# Patient Record
Sex: Male | Born: 1940 | Race: White | Hispanic: No | Marital: Married | Smoking: Former smoker
Health system: Southern US, Community
[De-identification: ages and names within clinical notes are randomized; demographics above are authoritative.]

## PROBLEM LIST (undated history)

## (undated) DIAGNOSIS — N200 Calculus of kidney: Secondary | ICD-10-CM

## (undated) DIAGNOSIS — I428 Other cardiomyopathies: Secondary | ICD-10-CM

## (undated) DIAGNOSIS — I872 Venous insufficiency (chronic) (peripheral): Secondary | ICD-10-CM

## (undated) DIAGNOSIS — I10 Essential (primary) hypertension: Secondary | ICD-10-CM

## (undated) DIAGNOSIS — R0602 Shortness of breath: Secondary | ICD-10-CM

## (undated) DIAGNOSIS — Z86718 Personal history of other venous thrombosis and embolism: Secondary | ICD-10-CM

## (undated) DIAGNOSIS — I429 Cardiomyopathy, unspecified: Secondary | ICD-10-CM

## (undated) DIAGNOSIS — F32A Depression, unspecified: Secondary | ICD-10-CM

## (undated) DIAGNOSIS — I4891 Unspecified atrial fibrillation: Secondary | ICD-10-CM

## (undated) DIAGNOSIS — H269 Unspecified cataract: Secondary | ICD-10-CM

## (undated) DIAGNOSIS — N189 Chronic kidney disease, unspecified: Secondary | ICD-10-CM

## (undated) DIAGNOSIS — I499 Cardiac arrhythmia, unspecified: Secondary | ICD-10-CM

## (undated) DIAGNOSIS — N35919 Unspecified urethral stricture, male, unspecified site: Secondary | ICD-10-CM

## (undated) DIAGNOSIS — G709 Myoneural disorder, unspecified: Secondary | ICD-10-CM

## (undated) DIAGNOSIS — F329 Major depressive disorder, single episode, unspecified: Secondary | ICD-10-CM

## (undated) DIAGNOSIS — M1990 Unspecified osteoarthritis, unspecified site: Secondary | ICD-10-CM

## (undated) DIAGNOSIS — I739 Peripheral vascular disease, unspecified: Secondary | ICD-10-CM

## (undated) DIAGNOSIS — G4730 Sleep apnea, unspecified: Secondary | ICD-10-CM

## (undated) HISTORY — PX: BACK SURGERY: SHX140

## (undated) HISTORY — PX: SHOULDER SURGERY: SHX246

## (undated) HISTORY — PX: TONSILLECTOMY: SUR1361

## (undated) HISTORY — PX: HEMORROIDECTOMY: SUR656

## (undated) HISTORY — PX: COLONOSCOPY: SHX174

## (undated) HISTORY — DX: Unspecified atrial fibrillation: I48.91

## (undated) HISTORY — DX: Venous insufficiency (chronic) (peripheral): I87.2

## (undated) HISTORY — DX: Personal history of other venous thrombosis and embolism: Z86.718

## (undated) HISTORY — DX: Chronic kidney disease, unspecified: N18.9

## (undated) HISTORY — DX: Cardiomyopathy, unspecified: I42.9

## (undated) HISTORY — PX: KNEE SURGERY: SHX244

## (undated) HISTORY — DX: Other cardiomyopathies: I42.8

## (undated) HISTORY — PX: VASECTOMY: SHX75

---

## 2000-12-04 ENCOUNTER — Ambulatory Visit (HOSPITAL_BASED_OUTPATIENT_CLINIC_OR_DEPARTMENT_OTHER): Admission: RE | Admit: 2000-12-04 | Discharge: 2000-12-04 | Payer: Self-pay | Admitting: Orthopedic Surgery

## 2004-05-02 ENCOUNTER — Encounter: Admission: RE | Admit: 2004-05-02 | Discharge: 2004-05-02 | Payer: Self-pay | Admitting: Nephrology

## 2010-12-07 ENCOUNTER — Other Ambulatory Visit: Payer: Self-pay | Admitting: Family Medicine

## 2010-12-07 DIAGNOSIS — M545 Low back pain, unspecified: Secondary | ICD-10-CM

## 2010-12-11 ENCOUNTER — Ambulatory Visit
Admission: RE | Admit: 2010-12-11 | Discharge: 2010-12-11 | Disposition: A | Discharge status: Home / Self Care | Payer: Medicare Other | Source: Ambulatory Visit | Attending: Family Medicine | Admitting: Family Medicine

## 2010-12-11 DIAGNOSIS — M545 Low back pain, unspecified: Secondary | ICD-10-CM

## 2010-12-19 ENCOUNTER — Other Ambulatory Visit: Payer: Self-pay | Admitting: Family Medicine

## 2010-12-19 DIAGNOSIS — M79605 Pain in left leg: Secondary | ICD-10-CM

## 2010-12-19 DIAGNOSIS — M545 Low back pain, unspecified: Secondary | ICD-10-CM

## 2010-12-20 ENCOUNTER — Ambulatory Visit
Admission: RE | Admit: 2010-12-20 | Discharge: 2010-12-20 | Disposition: A | Discharge status: Home / Self Care | Payer: Medicare Other | Source: Ambulatory Visit | Attending: Family Medicine | Admitting: Family Medicine

## 2010-12-20 DIAGNOSIS — M545 Low back pain, unspecified: Secondary | ICD-10-CM

## 2010-12-20 DIAGNOSIS — M79605 Pain in left leg: Secondary | ICD-10-CM

## 2011-01-17 ENCOUNTER — Other Ambulatory Visit: Payer: Self-pay | Admitting: Family Medicine

## 2011-01-17 DIAGNOSIS — M545 Low back pain, unspecified: Secondary | ICD-10-CM

## 2011-01-18 ENCOUNTER — Ambulatory Visit
Admission: RE | Admit: 2011-01-18 | Discharge: 2011-01-18 | Disposition: A | Discharge status: Home / Self Care | Payer: Medicare Other | Source: Ambulatory Visit | Attending: Family Medicine | Admitting: Family Medicine

## 2011-01-18 DIAGNOSIS — M545 Low back pain, unspecified: Secondary | ICD-10-CM

## 2011-03-26 ENCOUNTER — Ambulatory Visit (HOSPITAL_BASED_OUTPATIENT_CLINIC_OR_DEPARTMENT_OTHER): Payer: Medicare Other | Attending: Cardiology

## 2011-03-26 DIAGNOSIS — G4733 Obstructive sleep apnea (adult) (pediatric): Secondary | ICD-10-CM | POA: Insufficient documentation

## 2011-03-26 DIAGNOSIS — I4891 Unspecified atrial fibrillation: Secondary | ICD-10-CM | POA: Insufficient documentation

## 2011-03-31 DIAGNOSIS — I4891 Unspecified atrial fibrillation: Secondary | ICD-10-CM

## 2011-03-31 DIAGNOSIS — G4733 Obstructive sleep apnea (adult) (pediatric): Secondary | ICD-10-CM

## 2011-03-31 NOTE — Procedures (Signed)
NAME:  JSHAUN, ABERNATHY NO.:  [id number]  MEDICAL RECORD NO.:  [id number]          PATIENT TYPE:  OUT  LOCATION:  SLEEP CENTER                 FACILITY:  [facility]  PHYSICIAN:  Lauraine Crespo D. Maple Hudson, MD, FCCP, FACPDATE OF BIRTH:  Oct 30[birth year] 1941  DATE OF STUDY:  03/26/2011                           NOCTURNAL POLYSOMNOGRAM  REFERRING PHYSICIAN:  W SPENCER TILLEY JR  INDICATION FOR STUDY:  Hypersomnia with sleep apnea.  EPWORTH SLEEPINESS SCORE:  4/24, BMI 42, weight 308 pounds, height 72 inches, neck 18 inches.  MEDICATIONS:  Charted and reviewed.  SLEEP ARCHITECTURE:  Split study protocol.  During the diagnostic phase, total sleep time 122 minutes with sleep efficiency 79.7%.  Stage I was 39.8%, stage II 56.6%, stage III 0%, REM 3.7% of total sleep time. Sleep latency 14 minutes, REM latency 81 minutes, awake after sleep onset 17 minutes, arousal index 27.  BEDTIME MEDICATION:  None.  RESPIRATORY DATA:  Split study protocol.  Apnea/hypopnea index (AHI) 70.8 per hour.  A total of 114 events was scored including 14 obstructive apneas, 4 central apneas, 1 mixed apnea, 125 hypopneas. Events were not positional.  REM AHI 93.3 per hour.  CPAP was then titrated to 15 CWP, AHI 26.3 per hour indicating inadequate control.  He was changed to bilevel (BiPAP) and titrated to a final pressure inspiratory 23/expiratory 18 CWP for an AHI of 8.7 per hour.  He wore a small ResMed Quattro FX full-face mask with heated humidifier.  Bi-Flex comfort setting of 3.  The patient removed his upper and lower partial dentures prior to the start of the study.  High leak was observed at higher pressures during the latter portion of the study and may require reconsideration of mask choice and best pressure settings in the home environment.  Help with this can be provided if necessary.  OXYGEN DATA:  Loud snoring before CPAP control with oxygen desaturation to a nadir of 75% on room air.  During the  CPAP and BiPAP titration, mean oxygen saturation held at 85.5% on room air.  Baseline room air oxygen saturation while awake on arrival was 93%.  Consider overnight oximetry while wearing BiPAP in the home environment to determine if supplemental therapeutic oxygen would be appropriate.  CARDIAC DATA:  Atrial fibrillation between 70 and 80 beats per minute.  MOVEMENT-PARASOMNIA:  No significant movement disturbance.  Bathroom x1.  IMPRESSIONS-RECOMMENDATIONS: 1. Severe obstructive sleep apnea/hypopnea syndrome, apnea/hypopnea     index 70.8 per hour with nonpositional events, loud snoring, and     oxygen desaturation to a nadir of 75% on room air. 2. Continuous positive airway pressure titration to 15 mL CWP failed     to provide adequate control with a residual apnea/hypopnea index of     26.3 per hour.  Bilevel (bilevel positive airway pressure)     titration to a final pressure inspiratory 23, expiratory 18 CWP     yielded an apnea/hypopnea index of 8.7 per hour.  He wore a small     ResMed Quattro FX full-face mask with a Bi-Flex setting of 3 and     heated humidity.  At higher pressures, significant mask leak was  noted.  He may require reconsideration of final pressure settings     and a mask fit. 3. Oxygen saturation remained low on room air despite titration.     Before continuous positive airway pressure, he desaturated to a     nadir of 75% on room air.  With PAP titration, mean oxygen     saturation on room air was 85.5%.  Saturation was 93% on room air     on arrival while awake.  Consider overnight oximetry while wearing     bilevel positive airway pressure in the home environment to     determine if supplemental oxygen during sleep would be appropriate.     Merlyn Conley D. Maple Hudson, MD, [facility], FACP Diplomate, Biomedical engineer of Sleep Medicine Electronically Signed    CDY/MEDQ  D:  03/31/2011 10:09:27  T:  03/31/2011 11:43:00  Job:  [id number]

## 2011-05-02 DIAGNOSIS — G4730 Sleep apnea, unspecified: Secondary | ICD-10-CM

## 2011-05-02 HISTORY — DX: Sleep apnea, unspecified: G47.30

## 2011-05-08 ENCOUNTER — Encounter: Payer: Self-pay | Admitting: Pulmonary Disease

## 2011-05-09 ENCOUNTER — Encounter: Payer: Self-pay | Admitting: Pulmonary Disease

## 2011-05-09 ENCOUNTER — Ambulatory Visit (INDEPENDENT_AMBULATORY_CARE_PROVIDER_SITE_OTHER): Payer: Medicare Other | Admitting: Pulmonary Disease

## 2011-05-09 VITALS — BP 120/78 | HR 72 | Temp 97.9°F | Ht 72.0 in | Wt 319.0 lb

## 2011-05-09 DIAGNOSIS — I749 Embolism and thrombosis of unspecified artery: Secondary | ICD-10-CM

## 2011-05-09 DIAGNOSIS — E119 Type 2 diabetes mellitus without complications: Secondary | ICD-10-CM | POA: Insufficient documentation

## 2011-05-09 DIAGNOSIS — I499 Cardiac arrhythmia, unspecified: Secondary | ICD-10-CM | POA: Insufficient documentation

## 2011-05-09 DIAGNOSIS — G4730 Sleep apnea, unspecified: Secondary | ICD-10-CM | POA: Insufficient documentation

## 2011-05-09 DIAGNOSIS — J309 Allergic rhinitis, unspecified: Secondary | ICD-10-CM | POA: Insufficient documentation

## 2011-05-09 DIAGNOSIS — I10 Essential (primary) hypertension: Secondary | ICD-10-CM | POA: Insufficient documentation

## 2011-05-09 DIAGNOSIS — G4733 Obstructive sleep apnea (adult) (pediatric): Secondary | ICD-10-CM | POA: Insufficient documentation

## 2011-05-09 HISTORY — DX: Type 2 diabetes mellitus without complications: E11.9

## 2011-05-09 HISTORY — DX: Embolism and thrombosis of unspecified artery: I74.9

## 2011-05-09 NOTE — Progress Notes (Signed)
Subjective:    Patient ID: Devin Rios, male    DOB: 05-27-1941, 70 y.o.   MRN: [id number]  HPI The patient is a 70 year old male who I've been asked to see or an abnormal sleep study.  Patient underwent nocturnal polysomnography in June of this year, and was found to have severe obstructive sleep apnea.  He underwent a split night protocol where he was found to have an apnea index of 71 events per hour in the first 2 hours of sleep, and was ultimately titrated to a fairly high level of pressure on bilevel in order to control his events.  The patient states that he does snore, but his wife really does not complain excessively about this.  He states that she is not commented on an abnormal breathing pattern during sleep.  He currently works rotating shifts, but feels that he is fairly rested upon arising from sleep.  He denies any abnormal sleepiness during his waking hours, and feels that his alertness is adequate.  He denies any sleepiness with driving.  He states that his weight is neutral from 2 years ago, and his Epworth score today is only 6.  What time do you typically go to bed?( Between what hours) I work rotating shifts How long does it take you to fall asleep? 10 min How many times during the night do you wake up? 2 What time do you get out of bed to start your day? Do you drive or operate heavy machinery in your occupation? No How much has your weight changed (up or down) over the past two years? (In pounds) 0 oz (0 kg) Have you ever had a sleep study before? Yes If yes, location of study? [facility] If yes, date of study? 03/27/2011 Do you currently use CPAP? No Do you wear oxygen at any time? No    Review of Systems  Constitutional: Negative.  Negative for fever and unexpected weight change.  HENT: Positive for congestion and sneezing. Negative for ear pain, nosebleeds, sore throat, rhinorrhea, trouble swallowing, dental problem, postnasal drip and sinus pressure.   Eyes: Negative.   Negative for redness and itching.  Respiratory: Negative.  Negative for cough, chest tightness, shortness of breath and wheezing.   Cardiovascular: Positive for palpitations. Negative for leg swelling.  Gastrointestinal: Negative.  Negative for nausea and vomiting.  Genitourinary: Negative.  Negative for dysuria.  Musculoskeletal: Negative.  Negative for joint swelling.  Skin: Negative for rash.  Neurological: Negative.  Negative for headaches.  Hematological: Negative.  Does not bruise/bleed easily.  Psychiatric/Behavioral: Negative.  Negative for dysphoric mood. The patient is not nervous/anxious.        Objective:   Physical Exam Constitutional:  Well developed, no acute distress  HENT:  Nares patent without discharge, but +turbinate hypertrophy  Oropharynx without exudate, palate and uvula are significantly elongated with narrowing posteriorly   Eyes:  Perrla, eomi, no scleral icterus  Neck:  No JVD, no TMG  Cardiovascular:  Normal rate, rhythm sounds regular currently, no rubs or gallops.  No murmurs        Intact distal pulses  Pulmonary :  Normal breath sounds, no stridor or respiratory distress   No rales, rhonchi, or wheezing  Abdominal:  Soft, nondistended, bowel sounds present.  No tenderness noted.   Musculoskeletal:  + lower extremity edema noted.  Lymph Nodes:  No cervical lymphadenopathy noted  Skin:  No cyanosis noted  Neurologic:  Alert, appropriate, moves all 4 extremities without obvious deficit.  Assessment & Plan:

## 2011-05-13 ENCOUNTER — Encounter: Payer: Self-pay | Admitting: Pulmonary Disease

## 2011-05-13 NOTE — Assessment & Plan Note (Signed)
The patient has severe obstructive sleep apnea on his recent sleep study, and would greatly benefit from a positive pressure device as well as weight loss.  However, the patient feels that he cannot tolerate anything "blowing on my face", and is not willing to try CPAP or even oxygen.  Quite frankly, he does not feel that he has an issue and doubts the significance of the sleep study results.  I have had a frank discussion with him about the impact of sleep apnea on his cardiovascular health.  He still is not willing to consider CPAP therapy or oxygen, but states that he will work on weight loss.  I have left the door open for him to call at any time to reconsider trying CPAP.

## 2011-05-13 NOTE — Patient Instructions (Signed)
Work on weight loss Please call if you wish to consider cpap or oxygen for treatment of your sleep apnea.

## 2012-06-09 ENCOUNTER — Other Ambulatory Visit: Payer: Self-pay | Admitting: Cardiology

## 2012-09-01 ENCOUNTER — Other Ambulatory Visit: Payer: Self-pay | Admitting: Nephrology

## 2012-09-01 ENCOUNTER — Ambulatory Visit
Admission: RE | Admit: 2012-09-01 | Discharge: 2012-09-01 | Disposition: A | Discharge status: Home / Self Care | Payer: Medicare Other | Source: Ambulatory Visit | Attending: Nephrology | Admitting: Nephrology

## 2012-09-01 DIAGNOSIS — R109 Unspecified abdominal pain: Secondary | ICD-10-CM

## 2012-09-01 DIAGNOSIS — N200 Calculus of kidney: Secondary | ICD-10-CM

## 2012-10-16 ENCOUNTER — Other Ambulatory Visit: Payer: Self-pay | Admitting: Urology

## 2012-10-22 ENCOUNTER — Encounter (HOSPITAL_BASED_OUTPATIENT_CLINIC_OR_DEPARTMENT_OTHER): Payer: Self-pay | Admitting: *Deleted

## 2012-10-22 NOTE — Progress Notes (Signed)
Last office visit note, most recent ekg, and cardiac studies requested from Dr. York Spaniel office.

## 2012-10-22 NOTE — Progress Notes (Signed)
Pt instructed npo p mn 1/27 x lotrel, coreg  w sip of water.  Ok to take pain med w sip of water if neede.  To [facility] 1/28 @ 0845.  Needs istat on arrival.  Left message for [facility] @  [facility] regarding  When pt is to stop eliquis. Dr. York Spaniel office closed for lunch.  Will call later to request ekg, last office visit note, and cardiac studies.

## 2012-10-24 NOTE — Progress Notes (Signed)
Called and spoke with Mr Formica-informed of new arrival time of 0730 on 10/28/12.

## 2012-10-27 NOTE — H&P (Signed)
History of Present Illness                 F/u LUTS and back pain referred by Dr. Arrie Aran Dec 2013. Patient has had feelings of  weak stream and back pain for about 6 weeks. He had dysuria. He voids with a good stream, no straining. He feels empty. No incontience. No gross hematuria. He took Cipro and another abx. Abx helped the symptoms but he only took 3 -5 days. His U/A today shows possible UTI. His urine has been cloudy and he is getting some dysuria again. No fever or chills.   -Sep 2013 labs: bun 18, cr 1.6 (gfr ~ 45), lft's normal -Dec 2013 CT A/P showed a RL cyst, no renal or ureteral calculi and no hydronephrosis. I reviewed all the images.   -Dec 2013 - normal DRE; urine Cx + e coli and tx with cephalexin.   His PMH includes coumadin use, a fib, stage III CKD, HTN, DM, OSA, obesity.   He has had trouble getting and maintaining erection for several years. He does get some AM erections. He bought a VED but hadnt tried it.    Interval Hx He returns for PVR and cystoscopy.   Jan 2014 / today PVR = 65 ml  He recalls needing a catheter when he had hemorrhoid surgery in the past. We discussed proceeding with cystoscopy to investigate the cause of his lower urinary tract symptoms and UTI.   Past Medical History Problems  1. History of  Atrial Fibrillation 427.31 2. History of  Diabetes Mellitus 250.00 3. History of  Hypertension 401.9 4. Tobacco Use 305.1  Surgical History Problems  1. History of  Back Surgery 2. History of  Knee Surgery Right 3. History of  Shoulder Surgery Right 4. History of  Surgery Of Male Genitalia Vasectomy V25.2 5. History of  Tonsillectomy  Current Meds 1. AmLODIPine Besylate TABS; Therapy: (Recorded:30Dec2013) to 2. Carvedilol 25 MG Oral Tablet; Therapy: (Recorded:30Dec2013) to 3. Cephalexin 500 MG Oral Capsule; TAKE 1 CAPSULE TWICE DAILY; Therapy: 03Jan2014 to  (Evaluate:17Jan2014)  Requested for: 07Jan2014; Last Rx:03Jan2014 4. Eliquis 5 MG  Oral Tablet; Therapy: (Recorded:30Dec2013) to 5. Furosemide 40 MG Oral Tablet; Therapy: (Recorded:30Dec2013) to 6. Micardis TABS; Therapy: (Recorded:30Dec2013) to 7. Spironolactone 25 MG Oral Tablet; Therapy: (Recorded:30Dec2013) to  Allergies Medication  1. No Known Drug Allergies  Family History Problems  1. Maternal history of  Family Health Status Number Of Children 1 son 2. Maternal history of  Father Deceased At Age 8 3. Maternal history of  Mother Deceased At Age 79 4. Paternal history of  Nephrolithiasis 5. Paternal history of  Stroke Syndrome V17.1 6. Maternal history of  Stroke Syndrome V17.1  Social History Problems  1. Caffeine Use 4-5/day 2. Marital History - Currently Married Denied  3. History of  Alcohol Use  Review of Systems Constitutional, cardiovascular, pulmonary and neurological system(s) were reviewed and pertinent findings if present are noted.    Physical Exam Constitutional: Well nourished and well developed . No acute distress.  Pulmonary: No respiratory distress and normal respiratory rhythm and effort.  Cardiovascular: Heart rate and rhythm are normal . No peripheral edema.  Neuro/Psych:. Mood and affect are appropriate.    Results/Data Urine [Data Includes: Last 1 Day]   15Jan2014  COLOR YELLOW   APPEARANCE CLEAR   SPECIFIC GRAVITY 1.015   pH 5.5   GLUCOSE NEG mg/dL  BILIRUBIN NEG   KETONE NEG mg/dL  BLOOD NEG   PROTEIN  NEG mg/dL  UROBILINOGEN 0.2 mg/dL  NITRITE NEG   LEUKOCYTE ESTERASE TRACE   SQUAMOUS EPITHELIAL/HPF RARE   WBC 3-6 WBC/hpf  RBC NONE SEEN RBC/hpf  BACTERIA RARE   CRYSTALS NONE SEEN   CASTS NONE SEEN    Procedure  Procedure: Cystoscopy   Indication: Lower Urinary Tract Symptoms. Frequent UTI.  Informed Consent: Risks, benefits, and potential adverse events were discussed and informed consent was obtained from the patient.  Prep: The patient was prepped with betadine.  Antibiotic prophylaxis:. Cephalexin.    Procedure Note:  Urethral meatus:. No abnormalities.  Anterior urethra:. A moderate stricture was present in the bulbar urethra measuring cm, but was not manipulated.  Bladder: The patient tolerated the procedure well.  Complications: None.    Assessment Assessed  1. Benign Prostatic Hypertrophy 600.00 2. Pyuria 791.9 3. 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 Follow-up Schedule Surgery Office  Follow-up  Done: 15Jan2014  UA With REFLEX  Status: Resulted - Requires Verification  Done: 01Jan0001 12:00AM Ordered Today; For: Health Maintenance (V70.0); Ordered By: Jerilee Field  Due: 17Jan2014 Marked Important; Last Updated By: Thomasenia Sales   Discussion/Summary     I discussed the cystoscopic findings with the patient. We discussed the nature, R/B of urethral stricture dilation in the OR. We discussed general surgical and anesthetic risks, and other options including watchful waiting were discussed. He understands that dilation is generally successful in most cases but long-term success rates are low. We talked about the risk of persistent, de novo, or worsening incontinence and voiding dysfunction. Risks were described but not limited to the discussion of injury to neighboring structures and soft tissues. Bleeding, infection, pain, erectile dysfunction, and spraying of urination were discussed. The risk of neuropathy was discussed as well as the usual post-operative course.  All questions answered. Pt elects to proceed.    cc; Dr. Arrie Aran     Signatures Electronically signed by : Jerilee Field, M.D.; Oct 15 2012  3:03PM

## 2012-10-28 ENCOUNTER — Encounter (HOSPITAL_BASED_OUTPATIENT_CLINIC_OR_DEPARTMENT_OTHER): Payer: Self-pay | Admitting: Anesthesiology

## 2012-10-28 ENCOUNTER — Encounter (HOSPITAL_BASED_OUTPATIENT_CLINIC_OR_DEPARTMENT_OTHER)
Admission: RE | Disposition: A | Discharge status: Home / Self Care | Payer: Self-pay | Source: Ambulatory Visit | Attending: Urology

## 2012-10-28 ENCOUNTER — Ambulatory Visit (HOSPITAL_BASED_OUTPATIENT_CLINIC_OR_DEPARTMENT_OTHER): Payer: Medicare Other | Admitting: Anesthesiology

## 2012-10-28 ENCOUNTER — Ambulatory Visit (HOSPITAL_BASED_OUTPATIENT_CLINIC_OR_DEPARTMENT_OTHER)
Admission: RE | Admit: 2012-10-28 | Discharge: 2012-10-28 | Disposition: A | Discharge status: Home / Self Care | Payer: Medicare Other | Source: Ambulatory Visit | Attending: Urology | Admitting: Urology

## 2012-10-28 ENCOUNTER — Encounter (HOSPITAL_BASED_OUTPATIENT_CLINIC_OR_DEPARTMENT_OTHER): Payer: Self-pay | Admitting: *Deleted

## 2012-10-28 DIAGNOSIS — E669 Obesity, unspecified: Secondary | ICD-10-CM | POA: Insufficient documentation

## 2012-10-28 DIAGNOSIS — N35919 Unspecified urethral stricture, male, unspecified site: Secondary | ICD-10-CM | POA: Insufficient documentation

## 2012-10-28 DIAGNOSIS — I10 Essential (primary) hypertension: Secondary | ICD-10-CM | POA: Insufficient documentation

## 2012-10-28 DIAGNOSIS — N390 Urinary tract infection, site not specified: Secondary | ICD-10-CM | POA: Insufficient documentation

## 2012-10-28 DIAGNOSIS — Z79899 Other long term (current) drug therapy: Secondary | ICD-10-CM | POA: Insufficient documentation

## 2012-10-28 DIAGNOSIS — G4733 Obstructive sleep apnea (adult) (pediatric): Secondary | ICD-10-CM | POA: Insufficient documentation

## 2012-10-28 DIAGNOSIS — E119 Type 2 diabetes mellitus without complications: Secondary | ICD-10-CM | POA: Insufficient documentation

## 2012-10-28 DIAGNOSIS — N400 Enlarged prostate without lower urinary tract symptoms: Secondary | ICD-10-CM | POA: Insufficient documentation

## 2012-10-28 DIAGNOSIS — I4891 Unspecified atrial fibrillation: Secondary | ICD-10-CM | POA: Insufficient documentation

## 2012-10-28 HISTORY — DX: Unspecified cataract: H26.9

## 2012-10-28 HISTORY — DX: Sleep apnea, unspecified: G47.30

## 2012-10-28 HISTORY — DX: Unspecified urethral stricture, male, unspecified site: N35.919

## 2012-10-28 HISTORY — DX: Unspecified osteoarthritis, unspecified site: M19.90

## 2012-10-28 HISTORY — PX: CYSTOSCOPY WITH URETHRAL DILATATION: SHX5125

## 2012-10-28 LAB — POCT I-STAT 4, (NA,K, GLUC, HGB,HCT)
Glucose, Bld: 178 mg/dL — ABNORMAL HIGH (ref 70–99)
HCT: 43 % (ref 39.0–52.0)
Hemoglobin: 14.6 g/dL (ref 13.0–17.0)
Potassium: 4.1 mEq/L (ref 3.5–5.1)
Sodium: 142 mEq/L (ref 135–145)

## 2012-10-28 LAB — GLUCOSE, CAPILLARY: Glucose-Capillary: 157 mg/dL — ABNORMAL HIGH (ref 70–99)

## 2012-10-28 SURGERY — CYSTOSCOPY, WITH URETHRAL DILATION
Anesthesia: General | Site: Urethra | Wound class: Clean Contaminated

## 2012-10-28 MED ORDER — CEFAZOLIN SODIUM 1-5 GM-% IV SOLN
1.0000 g | INTRAVENOUS | Status: DC
  Filled 2012-10-28: qty 50

## 2012-10-28 MED ORDER — CEFAZOLIN SODIUM 10 G IJ SOLR
3.0000 g | INTRAMUSCULAR | Status: AC
  Administered 2012-10-28: 3 g via INTRAVENOUS
  Filled 2012-10-28: qty 3000

## 2012-10-28 MED ORDER — PROMETHAZINE HCL 25 MG/ML IJ SOLN
6.2500 mg | INTRAMUSCULAR | Status: DC | PRN
  Filled 2012-10-28: qty 1

## 2012-10-28 MED ORDER — FENTANYL CITRATE 0.05 MG/ML IJ SOLN
25.0000 ug | INTRAMUSCULAR | Status: DC | PRN
  Filled 2012-10-28: qty 1

## 2012-10-28 MED ORDER — EPHEDRINE SULFATE 50 MG/ML IJ SOLN
INTRAMUSCULAR | Status: DC | PRN
  Administered 2012-10-28: 10 mg via INTRAVENOUS

## 2012-10-28 MED ORDER — CEPHALEXIN 500 MG PO CAPS: 500.0000 mg | ORAL_CAPSULE | Freq: Every day | ORAL | Status: DC

## 2012-10-28 MED ORDER — STERILE WATER FOR IRRIGATION IR SOLN
Status: DC | PRN
  Administered 2012-10-28: 3000 mL

## 2012-10-28 MED ORDER — LACTATED RINGERS IV SOLN
INTRAVENOUS | Status: DC
  Administered 2012-10-28 (×2): via INTRAVENOUS
  Filled 2012-10-28: qty 1000

## 2012-10-28 MED ORDER — FENTANYL CITRATE 0.05 MG/ML IJ SOLN
INTRAMUSCULAR | Status: DC | PRN
  Administered 2012-10-28: 25 ug via INTRAVENOUS
  Administered 2012-10-28: 50 ug via INTRAVENOUS
  Administered 2012-10-28: 25 ug via INTRAVENOUS

## 2012-10-28 MED ORDER — PROPOFOL 10 MG/ML IV BOLUS
INTRAVENOUS | Status: DC | PRN
  Administered 2012-10-28: 30 mg via INTRAVENOUS
  Administered 2012-10-28: 300 mg via INTRAVENOUS

## 2012-10-28 MED ORDER — LIDOCAINE HCL (CARDIAC) 20 MG/ML IV SOLN
INTRAVENOUS | Status: DC | PRN
  Administered 2012-10-28: 80 mg via INTRAVENOUS

## 2012-10-28 SURGICAL SUPPLY — 22 items
BAG DRAIN URO-CYSTO SKYTR STRL (DRAIN) ×2 IMPLANT
BAG URINE LEG 19OZ MD ST LTX (BAG) ×2 IMPLANT
BALLN NEPHROSTOMY (BALLOONS)
BALLOON NEPHROSTOMY (BALLOONS) IMPLANT
CANISTER SUCT LVC 12 LTR MEDI- (MISCELLANEOUS) ×2 IMPLANT
CATH FOLEY 2WAY SLVR  5CC 18FR (CATHETERS) ×1
CATH FOLEY 2WAY SLVR 5CC 18FR (CATHETERS) ×1 IMPLANT
CLOTH BEACON ORANGE TIMEOUT ST (SAFETY) ×2 IMPLANT
DRAPE CAMERA CLOSED 9X96 (DRAPES) ×2 IMPLANT
ELECT REM PT RETURN 9FT ADLT (ELECTROSURGICAL)
ELECTRODE REM PT RTRN 9FT ADLT (ELECTROSURGICAL) IMPLANT
GLOVE BIO SURGEON STRL SZ 6.5 (GLOVE) ×2 IMPLANT
GLOVE BIO SURGEON STRL SZ7.5 (GLOVE) ×2 IMPLANT
GLOVE ECLIPSE 6.0 STRL STRAW (GLOVE) ×2 IMPLANT
GOWN PREVENTION PLUS LG XLONG (DISPOSABLE) ×4 IMPLANT
GOWN STRL REIN XL XLG (GOWN DISPOSABLE) IMPLANT
GUIDEWIRE 0.038 PTFE COATED (WIRE) IMPLANT
GUIDEWIRE ANG ZIPWIRE 038X150 (WIRE) IMPLANT
GUIDEWIRE STR DUAL SENSOR (WIRE) IMPLANT
PACK CYSTOSCOPY (CUSTOM PROCEDURE TRAY) ×2 IMPLANT
SYRINGE IRR TOOMEY STRL 70CC (SYRINGE) IMPLANT
WATER STERILE IRR 3000ML UROMA (IV SOLUTION) ×2 IMPLANT

## 2012-10-28 NOTE — Anesthesia Preprocedure Evaluation (Addendum)
Anesthesia Evaluation  Patient identified by MRN, date of birth, ID band Patient awake    Reviewed: Allergy & Precautions, H&P , NPO status , Patient's Chart, lab work & pertinent test results  Airway Mallampati: III TM Distance: <3 FB Neck ROM: Full    Dental No notable dental hx.    Pulmonary former smoker,  breath sounds clear to auscultation  + decreased breath sounds      Cardiovascular hypertension, Pt. on medications +CHF + dysrhythmias Atrial Fibrillation Rhythm:Irregular Rate:Normal - Systolic murmurs EF 40-45%   Neuro/Psych negative neurological ROS  negative psych ROS   GI/Hepatic negative GI ROS, Neg liver ROS,   Endo/Other  diabetesMorbid obesity  Renal/GU Renal InsufficiencyRenal disease  negative genitourinary   Musculoskeletal negative musculoskeletal ROS (+)   Abdominal   Peds negative pediatric ROS (+)  Hematology negative hematology ROS (+)   Anesthesia Other Findings   Reproductive/Obstetrics negative OB ROS                          Anesthesia Physical Anesthesia Plan  ASA: III  Anesthesia Plan: General   Post-op Pain Management:    Induction: Intravenous  Airway Management Planned: LMA  Additional Equipment:   Intra-op Plan:   Post-operative Plan:   Informed Consent: I have reviewed the patients History and Physical, chart, labs and discussed the procedure including the risks, benefits and alternatives for the proposed anesthesia with the patient or authorized representative who has indicated his/her understanding and acceptance.   Dental advisory given  Plan Discussed with: CRNA and Surgeon  Anesthesia Plan Comments: (Fluid restrict)       Anesthesia Quick Evaluation

## 2012-10-28 NOTE — Transfer of Care (Signed)
Immediate Anesthesia Transfer of Care Note  Patient: Devin Rios  Procedure(s) Performed: Procedure(s) (LRB): CYSTOSCOPY WITH URETHRAL DILATATION (N/A)  Patient Location: Patient transported to PACU with oxygen via face mask at 4 Liters / Min  Anesthesia Type: General  Level of Consciousness: awake and alert   Airway & Oxygen Therapy: Patient Spontanous Breathing and Patient connected to face mask oxygen  Post-op Assessment: Report given to PACU RN and Post -op Vital signs reviewed and stable  Post vital signs: Reviewed and stable  Dentition: Teeth and oropharynx remain in pre-op condition  Complications: No apparent anesthesia complications

## 2012-10-28 NOTE — Anesthesia Postprocedure Evaluation (Signed)
  Anesthesia Post-op Note  Patient: Devin Rios  Procedure(s) Performed: Procedure(s) (LRB): CYSTOSCOPY WITH URETHRAL DILATATION (N/A)  Patient Location: PACU  Anesthesia Type: General  Level of Consciousness: awake and alert   Airway and Oxygen Therapy: Patient Spontanous Breathing  Post-op Pain: mild  Post-op Assessment: Post-op Vital signs reviewed, Patient's Cardiovascular Status Stable, Respiratory Function Stable, Patent Airway and No signs of Nausea or vomiting  Last Vitals:  Filed Vitals:   10/28/12 0927  BP: 113/54  Pulse: 97  Temp: 36.8 C  Resp: 10    Post-op Vital Signs: stable   Complications: No apparent anesthesia complications

## 2012-10-28 NOTE — Op Note (Signed)
Preoperative diagnosis: Urinary tract infection, urethral stricture, BPH Postoperative diagnosis: Same  Procedure: Cystoscopy with visual dilation of urethral stricture  Surgeon: [name]  Anesthesia: Gen.  Findings: Cystoscopy-there was a soft 10-12 [place] urethral stricture at the junction of the bulbar and penile urethra. This was visually dilated with a 22 [place] cystoscope. The remainder the bulb and membranous urethra appeared normal. The prostatic urethra was slightly elongated with some lateral lobe hypertrophy it was 100% visually obstructing. The bladder was moderately trabeculated without stone, lesion or foreign body. The mucosa appeared normal. The trigone and ureteral orifice these appeared normal and in their orthotopic position.  Description of procedure: After consent was obtained patient brought to the operating room. After adequate anesthesia he was placed in lithotomy position and prepped and draped in the usual fashion. A timeout was performed to confirm the patient and procedure. The 22 French cystoscope was passed per urethra where it narrowed area was noted along the penile urethra but primarily focused at the junction of the bulbar and penile urethra. As the scope was passed the stricture was visually dilated without difficulty. Pre-and post photos were taken. The remainder of the urethra and prostatic urethra and bladder were carefully examined. The bladder was examined in its entirety with a 12 and 70 lens.  The scope was removed and an 91 French Foley placed and left gravity drainage. The urine was clear. The patient was awakened and taken to the recovery room in stable condition.  Specimens: None  Complications: None  Estimated blood loss: Minimal  Drains: 18 French Foley  Disposition: Patient stable to PACU

## 2012-10-28 NOTE — Interval H&P Note (Signed)
History and Physical Interval Note:  10/28/2012 8:36 AM  Devin Rios  has presented today for surgery, with the diagnosis of Urethral Stricture  The various methods of treatment have been discussed with the patient and family. After consideration of risks, benefits and other options for treatment, the patient has consented to  Procedure(s) (LRB) with comments: CYSTOSCOPY WITH URETHRAL DILATATION (N/A) as a surgical intervention. We discussed side effects of the proposed treatment, the likelihood of the patient achieving the goals of the procedure, and any potential problems that might occur during the procedure or recuperation. All questions answered. Patient elects to proceed.  He's had no fever or dysuria.   His I-stat and vitals are normal.  [name], [name]

## 2012-10-28 NOTE — Interval H&P Note (Signed)
History and Physical Interval Note:  10/28/2012 8:43 AM  I should add Dr. Donnie Aho cleared pt for surgery and to stop Eliquis prior to surgery/restart after surgery.    Devin Rios

## 2012-10-28 NOTE — Anesthesia Procedure Notes (Signed)
Procedure Name: LMA Insertion Date/Time: 10/28/2012 9:00 AM Performed by: Fran Lowes Pre-anesthesia Checklist: Patient identified, Emergency Drugs available, Suction available and Patient being monitored Patient Re-evaluated:Patient Re-evaluated prior to inductionOxygen Delivery Method: Circle System Utilized Preoxygenation: Pre-oxygenation with 100% oxygen Intubation Type: IV induction Ventilation: Mask ventilation without difficulty LMA: LMA inserted LMA Size: 4.0 Number of attempts: 1 Airway Equipment and Method: bite block Placement Confirmation: positive ETCO2 Tube secured with: Tape Dental Injury: Teeth and Oropharynx as per pre-operative assessment

## 2012-10-29 ENCOUNTER — Encounter (HOSPITAL_BASED_OUTPATIENT_CLINIC_OR_DEPARTMENT_OTHER): Payer: Self-pay | Admitting: Urology

## 2013-09-15 DIAGNOSIS — I4891 Unspecified atrial fibrillation: Secondary | ICD-10-CM

## 2013-09-15 DIAGNOSIS — N183 Chronic kidney disease, stage 3 unspecified: Secondary | ICD-10-CM

## 2013-09-15 HISTORY — DX: Unspecified atrial fibrillation: I48.91

## 2013-09-15 HISTORY — DX: Chronic kidney disease, stage 3 unspecified: N18.30

## 2013-10-21 DIAGNOSIS — M545 Low back pain, unspecified: Secondary | ICD-10-CM

## 2013-10-21 HISTORY — DX: Low back pain, unspecified: M54.50

## 2014-02-02 DIAGNOSIS — M5126 Other intervertebral disc displacement, lumbar region: Secondary | ICD-10-CM

## 2014-02-02 HISTORY — DX: Other intervertebral disc displacement, lumbar region: M51.26

## 2014-03-10 DIAGNOSIS — M519 Unspecified thoracic, thoracolumbar and lumbosacral intervertebral disc disorder: Secondary | ICD-10-CM

## 2014-03-10 HISTORY — DX: Unspecified thoracic, thoracolumbar and lumbosacral intervertebral disc disorder: M51.9

## 2014-03-11 ENCOUNTER — Other Ambulatory Visit: Payer: Self-pay | Admitting: Family Medicine

## 2014-03-11 DIAGNOSIS — M5430 Sciatica, unspecified side: Secondary | ICD-10-CM

## 2014-03-11 DIAGNOSIS — M545 Low back pain, unspecified: Secondary | ICD-10-CM

## 2014-03-12 ENCOUNTER — Ambulatory Visit
Admission: RE | Admit: 2014-03-12 | Discharge: 2014-03-12 | Disposition: A | Discharge status: Home / Self Care | Payer: Medicare Other | Source: Ambulatory Visit | Attending: Family Medicine | Admitting: Family Medicine

## 2014-03-12 DIAGNOSIS — M545 Low back pain, unspecified: Secondary | ICD-10-CM

## 2014-03-12 DIAGNOSIS — M5430 Sciatica, unspecified side: Secondary | ICD-10-CM

## 2014-05-14 ENCOUNTER — Other Ambulatory Visit: Payer: Self-pay | Admitting: Neurosurgery

## 2014-05-21 ENCOUNTER — Encounter (HOSPITAL_COMMUNITY): Payer: Self-pay | Admitting: Pharmacy Technician

## 2014-05-22 NOTE — Pre-Procedure Instructions (Signed)
Devin Rios  05/22/2014   Your procedure is scheduled on:  August 28  Report to [facility] Admitting at 11:30 AM.  Call this number if you have problems the morning of surgery: [phone]   Remember:   Do not eat food or drink liquids after midnight.   Take these medicines the morning of surgery with A SIP OF WATER: Amlodipine, Carvedilol, Gabapentin, Hydrocodone (if needed)   DO NOT TAKE ELIQUIS starting August 26   STOP/ Do not take Aspirin, Aleve, Naproxen, Advil, Ibuprofen, Motrin, Vitamins, Herbs, or Supplements starting today   Do not wear jewelry, make-up or nail polish.  Do not wear lotions, powders, or perfumes. You may wear deodorant.  Do not shave 48 hours prior to surgery. Men may shave face and neck.  Do not bring valuables to the hospital.  [facility] is not responsible for any belongings or valuables.               Contacts, dentures or bridgework may not be worn into surgery.  Leave suitcase in the car. After surgery it may be brought to your room.  For patients admitted to the hospital, discharge time is determined by your treatment team.               Patients discharged the day of surgery will not be allowed to drive home.  Name and phone number of your driver: Family/ Friend  Special Instructions: See [facility] Preparing For Surgery   Please read over the following fact sheets that you were given: Pain Booklet, Coughing and Deep Breathing and Surgical Site Infection Prevention

## 2014-05-24 ENCOUNTER — Encounter (HOSPITAL_COMMUNITY): Payer: Self-pay

## 2014-05-24 ENCOUNTER — Encounter (HOSPITAL_COMMUNITY)
Admission: RE | Admit: 2014-05-24 | Discharge: 2014-05-24 | Disposition: A | Discharge status: Home / Self Care | Payer: Medicare Other | Source: Ambulatory Visit | Attending: Neurosurgery | Admitting: Neurosurgery

## 2014-05-24 DIAGNOSIS — M5126 Other intervertebral disc displacement, lumbar region: Secondary | ICD-10-CM | POA: Diagnosis present

## 2014-05-24 DIAGNOSIS — Z01818 Encounter for other preprocedural examination: Secondary | ICD-10-CM | POA: Diagnosis present

## 2014-05-24 DIAGNOSIS — M48061 Spinal stenosis, lumbar region without neurogenic claudication: Secondary | ICD-10-CM | POA: Insufficient documentation

## 2014-05-24 HISTORY — DX: Myoneural disorder, unspecified: G70.9

## 2014-05-24 HISTORY — DX: Major depressive disorder, single episode, unspecified: F32.9

## 2014-05-24 HISTORY — DX: Cardiac arrhythmia, unspecified: I49.9

## 2014-05-24 HISTORY — DX: Depression, unspecified: F32.A

## 2014-05-24 HISTORY — DX: Peripheral vascular disease, unspecified: I73.9

## 2014-05-24 HISTORY — DX: Shortness of breath: R06.02

## 2014-05-24 HISTORY — DX: Essential (primary) hypertension: I10

## 2014-05-24 LAB — SURGICAL PCR SCREEN
MRSA, PCR: NEGATIVE
Staphylococcus aureus: NEGATIVE

## 2014-05-24 LAB — BASIC METABOLIC PANEL
Anion gap: 11 (ref 5–15)
BUN: 23 mg/dL (ref 6–23)
CO2: 26 mEq/L (ref 19–32)
Calcium: 9.4 mg/dL (ref 8.4–10.5)
Chloride: 108 mEq/L (ref 96–112)
Creatinine, Ser: 1.42 mg/dL — ABNORMAL HIGH (ref 0.50–1.35)
GFR calc Af Amer: 55 mL/min — ABNORMAL LOW (ref >90)
GFR calc non Af Amer: 48 mL/min — ABNORMAL LOW (ref >90)
Glucose, Bld: 124 mg/dL — ABNORMAL HIGH (ref 70–99)
Potassium: 4.9 mEq/L (ref 3.7–5.3)
Sodium: 145 mEq/L (ref 137–147)

## 2014-05-24 LAB — CBC
HCT: 46 % (ref 39.0–52.0)
Hemoglobin: 14.5 g/dL (ref 13.0–17.0)
MCH: 27.5 pg (ref 26.0–34.0)
MCHC: 31.5 g/dL (ref 30.0–36.0)
MCV: 87.3 fL (ref 78.0–100.0)
Platelets: 187 10*3/uL (ref 150–400)
RBC: 5.27 MIL/uL (ref 4.22–5.81)
RDW: 14.2 % (ref 11.5–15.5)
WBC: 7.9 10*3/uL (ref 4.0–10.5)

## 2014-05-24 NOTE — Progress Notes (Signed)
Requesting Dr. York Spaniel last office note & last ekg.  Pt. Reports that he had a stress test earlier this yr. & Dr. Donnie Aho remarked that it was better than the one done previously.

## 2014-05-24 NOTE — Progress Notes (Signed)
05/24/14 1224  OBSTRUCTIVE SLEEP APNEA  Have you ever been diagnosed with sleep apnea through a sleep study? Yes  If yes, do you have and use a CPAP or BPAP machine every night? 0  Do you snore loudly (loud enough to be heard through closed doors)?  0  Do you often feel tired, fatigued, or sleepy during the daytime? 1  Has anyone observed you stop breathing during your sleep? 0  Do you have, or are you being treated for high blood pressure? 1  BMI more than 35 kg/m2? 1  Age over 47 years old? 1  Neck circumference greater than 40 cm/16 inches? 1  Gender: 1  Obstructive Sleep Apnea Score 6  Score 4 or greater  Results sent to PCP

## 2014-05-24 NOTE — Progress Notes (Signed)
Pt. Aware of need to hold Eliquis after tomorrow

## 2014-05-25 ENCOUNTER — Encounter (HOSPITAL_COMMUNITY): Payer: Self-pay

## 2014-05-25 NOTE — Progress Notes (Signed)
Anesthesia chart review: Patient is a 73 year old Rios scheduled for left L3-4 microdiscectomy on 05/28/14 by Dr. Venetia Maxon.  History includes former smoker, HTN, cardiomyopathy (presumed non-ischemic according to cardiology notes), afib, DM2, OSA (he denied by notation; however diagnosed with severe OSA by Dr. Teddy Spike 05/13/11 note--no CPAP use), PVD (post-operative RLE "blood clot"), CKD stage III (reportedly related to BPH/uretral stricture now s/p dilation 10/28/12 and released from nephrologist Dr. Abel Presto), depression, back surgery, tonsillectomy. BMI is consistent with morbid obesity. OSA screening score was 6. PCP is Dr. Thayer Ohm in [place].  Cardiologist is Dr. Viann Fish who felt patient was acceptable risk for surgery with permission to hold Eliquis two days prior to procedure.   EKG on 05/14/14 showed: afib, low voltage QRS in precordial leads, long QT interval. HR was 87 bpm at PAT.  Echo on 08/19/13 (Dr. Donnie Aho) showed: Moderate concentric LVH with mild to moderate global hypokinesis and EF of 45%. Moderate left and right atrial enlargement. Mild right ventricular enlargement. Mild aortic root enlargement (2D AoD 3.7 cm). Mild mitral and tricuspid regurgitation.  Nuclear stress test on 04/12/11 (Dr. Donnie Aho) showed: Abnormal like this can clear light with evidence of cardiomyopathy. Fixed inferior defect consistent with attenuation or infarction. No evidence of ischemia. Abnormal quantitative gated SPECT EF of 45% with mild global hypokinesis.   CXR on 8/Devin/15 showed: No active cardiopulmonary disease.  Preoperative labs noted. Cr 1.42 (Cr 1.5 02/02/14 and 1.74 on 09/15/13).  Glucose 124.  (A1C 6.2 on 09/15/13.) CBC WNL.  He has been cleared with acceptable risk by his cardiologist. HR was controlled at PAT. Renal function is stable.  If no acute changes then I would anticipate that he could proceed as planned. By notes he does have a history of severe OSA without CPAP use, so he  may need RT to see post-operatively.     Velna Ochs [facility] Short Stay Center/Anesthesiology Phone [phone] 05/25/2014 11:03 AM

## 2014-05-27 MED ORDER — DEXTROSE 5 % IV SOLN
3.0000 g | INTRAVENOUS | Status: AC
  Administered 2014-05-28: 3 g via INTRAVENOUS
  Filled 2014-05-27: qty 3000

## 2014-05-28 ENCOUNTER — Ambulatory Visit (HOSPITAL_COMMUNITY): Payer: Medicare Other | Admitting: Anesthesiology

## 2014-05-28 ENCOUNTER — Encounter (HOSPITAL_COMMUNITY): Payer: Medicare Other | Admitting: Vascular Surgery

## 2014-05-28 ENCOUNTER — Encounter (HOSPITAL_COMMUNITY): Payer: Self-pay | Admitting: *Deleted

## 2014-05-28 ENCOUNTER — Encounter (HOSPITAL_COMMUNITY)
Admission: RE | Disposition: A | Discharge status: Home / Self Care | Payer: Self-pay | Source: Ambulatory Visit | Attending: Neurosurgery

## 2014-05-28 ENCOUNTER — Ambulatory Visit (HOSPITAL_COMMUNITY): Payer: Medicare Other

## 2014-05-28 ENCOUNTER — Observation Stay (HOSPITAL_COMMUNITY)
Admission: RE | Admit: 2014-05-28 | Discharge: 2014-05-29 | Disposition: A | Discharge status: Home / Self Care | Payer: Medicare Other | Source: Ambulatory Visit | Attending: Neurosurgery | Admitting: Neurosurgery

## 2014-05-28 DIAGNOSIS — M47817 Spondylosis without myelopathy or radiculopathy, lumbosacral region: Secondary | ICD-10-CM | POA: Insufficient documentation

## 2014-05-28 DIAGNOSIS — M5126 Other intervertebral disc displacement, lumbar region: Principal | ICD-10-CM | POA: Diagnosis present

## 2014-05-28 DIAGNOSIS — I10 Essential (primary) hypertension: Secondary | ICD-10-CM | POA: Insufficient documentation

## 2014-05-28 DIAGNOSIS — Z87440 Personal history of urinary (tract) infections: Secondary | ICD-10-CM | POA: Insufficient documentation

## 2014-05-28 DIAGNOSIS — I4891 Unspecified atrial fibrillation: Secondary | ICD-10-CM | POA: Insufficient documentation

## 2014-05-28 DIAGNOSIS — E119 Type 2 diabetes mellitus without complications: Secondary | ICD-10-CM | POA: Diagnosis not present

## 2014-05-28 HISTORY — PX: LUMBAR LAMINECTOMY/DECOMPRESSION MICRODISCECTOMY: SHX5026

## 2014-05-28 LAB — GLUCOSE, CAPILLARY
Glucose-Capillary: 144 mg/dL — ABNORMAL HIGH (ref 70–99)
Glucose-Capillary: 101 mg/dL — ABNORMAL HIGH (ref 70–99)
Glucose-Capillary: 116 mg/dL — ABNORMAL HIGH (ref 70–99)

## 2014-05-28 SURGERY — LUMBAR LAMINECTOMY/DECOMPRESSION MICRODISCECTOMY 1 LEVEL
Anesthesia: General | Site: Back | Laterality: Left

## 2014-05-28 MED ORDER — ZOLPIDEM TARTRATE 5 MG PO TABS: 5.0000 mg | ORAL_TABLET | Freq: Every evening | ORAL | Status: DC | PRN

## 2014-05-28 MED ORDER — OXYCODONE-ACETAMINOPHEN 5-325 MG PO TABS: 1.0000 | ORAL_TABLET | ORAL | Status: DC | PRN

## 2014-05-28 MED ORDER — ONDANSETRON HCL 4 MG/2ML IJ SOLN
INTRAMUSCULAR | Status: DC | PRN
  Administered 2014-05-28: 4 mg via INTRAVENOUS

## 2014-05-28 MED ORDER — ACETAMINOPHEN 325 MG PO TABS
650.0000 mg | ORAL_TABLET | ORAL | Status: DC | PRN
Start: 2014-05-28 — End: 2014-05-29

## 2014-05-28 MED ORDER — ARTIFICIAL TEARS OP OINT
TOPICAL_OINTMENT | OPHTHALMIC | Status: AC
  Filled 2014-05-28: qty 3.5

## 2014-05-28 MED ORDER — PHENOL 1.4 % MT LIQD: 1.0000 | OROMUCOSAL | Status: DC | PRN

## 2014-05-28 MED ORDER — MEPERIDINE HCL 25 MG/ML IJ SOLN: 6.2500 mg | INTRAMUSCULAR | Status: DC | PRN

## 2014-05-28 MED ORDER — GLIPIZIDE 10 MG PO TABS
10.0000 mg | ORAL_TABLET | Freq: Every day | ORAL | Status: DC
  Administered 2014-05-29: 10 mg via ORAL
  Filled 2014-05-28 (×2): qty 1

## 2014-05-28 MED ORDER — LIDOCAINE HCL (CARDIAC) 20 MG/ML IV SOLN
INTRAVENOUS | Status: AC
  Filled 2014-05-28: qty 5

## 2014-05-28 MED ORDER — ONDANSETRON HCL 4 MG/2ML IJ SOLN: 4.0000 mg | INTRAMUSCULAR | Status: DC | PRN

## 2014-05-28 MED ORDER — GLYCOPYRROLATE 0.2 MG/ML IJ SOLN
INTRAMUSCULAR | Status: DC | PRN
  Administered 2014-05-28: 0.6 mg via INTRAVENOUS

## 2014-05-28 MED ORDER — VITAMIN D3 25 MCG (1000 UNIT) PO TABS
5000.0000 [IU] | ORAL_TABLET | ORAL | Status: DC
  Filled 2014-05-28: qty 5

## 2014-05-28 MED ORDER — DOCUSATE SODIUM 100 MG PO CAPS
100.0000 mg | ORAL_CAPSULE | Freq: Every day | ORAL | Status: DC | PRN
  Filled 2014-05-28: qty 1

## 2014-05-28 MED ORDER — SODIUM CHLORIDE 0.9 % IV SOLN: 250.0000 mL | INTRAVENOUS | Status: DC

## 2014-05-28 MED ORDER — MORPHINE SULFATE 2 MG/ML IJ SOLN: 1.0000 mg | INTRAMUSCULAR | Status: DC | PRN

## 2014-05-28 MED ORDER — KCL IN DEXTROSE-NACL 20-5-0.45 MEQ/L-%-% IV SOLN
INTRAVENOUS | Status: DC
  Administered 2014-05-28: 22:00:00 via INTRAVENOUS
  Filled 2014-05-28 (×2): qty 1000

## 2014-05-28 MED ORDER — BISACODYL 10 MG RE SUPP: 10.0000 mg | Freq: Every day | RECTAL | Status: DC | PRN

## 2014-05-28 MED ORDER — SODIUM CHLORIDE 0.9 % IJ SOLN: 3.0000 mL | INTRAMUSCULAR | Status: DC | PRN

## 2014-05-28 MED ORDER — BUPIVACAINE HCL (PF) 0.5 % IJ SOLN
INTRAMUSCULAR | Status: DC | PRN
  Administered 2014-05-28: 10 mL

## 2014-05-28 MED ORDER — HYDROCODONE-ACETAMINOPHEN 5-325 MG PO TABS
1.0000 | ORAL_TABLET | ORAL | Status: DC | PRN
  Administered 2014-05-29: 2 via ORAL
  Administered 2014-05-29: 1 via ORAL
  Filled 2014-05-28: qty 1
  Filled 2014-05-28: qty 2

## 2014-05-28 MED ORDER — STERILE WATER FOR INJECTION IJ SOLN
INTRAMUSCULAR | Status: AC
  Filled 2014-05-28: qty 10

## 2014-05-28 MED ORDER — THROMBIN 5000 UNITS EX SOLR
CUTANEOUS | Status: DC | PRN
  Administered 2014-05-28 (×2): 5000 [IU] via TOPICAL

## 2014-05-28 MED ORDER — EPHEDRINE SULFATE 50 MG/ML IJ SOLN
INTRAMUSCULAR | Status: AC
  Filled 2014-05-28: qty 1

## 2014-05-28 MED ORDER — CEFAZOLIN SODIUM 1-5 GM-% IV SOLN
INTRAVENOUS | Status: AC
  Filled 2014-05-28: qty 50

## 2014-05-28 MED ORDER — HYDROCODONE-ACETAMINOPHEN 10-325 MG PO TABS
1.0000 | ORAL_TABLET | Freq: Three times a day (TID) | ORAL | Status: DC | PRN
  Administered 2014-05-28: 1 via ORAL
  Filled 2014-05-28: qty 1

## 2014-05-28 MED ORDER — PANTOPRAZOLE SODIUM 40 MG IV SOLR
40.0000 mg | Freq: Every day | INTRAVENOUS | Status: DC
  Filled 2014-05-28: qty 40

## 2014-05-28 MED ORDER — POLYETHYLENE GLYCOL 3350 17 G PO PACK
17.0000 g | PACK | Freq: Every day | ORAL | Status: DC | PRN
  Filled 2014-05-28: qty 1

## 2014-05-28 MED ORDER — SPIRONOLACTONE 25 MG PO TABS
25.0000 mg | ORAL_TABLET | Freq: Every day | ORAL | Status: DC
  Administered 2014-05-28: 25 mg via ORAL
  Filled 2014-05-28 (×2): qty 1

## 2014-05-28 MED ORDER — MIDAZOLAM HCL 5 MG/5ML IJ SOLN
INTRAMUSCULAR | Status: DC | PRN
  Administered 2014-05-28: 2 mg via INTRAVENOUS

## 2014-05-28 MED ORDER — BENAZEPRIL HCL 20 MG PO TABS
20.0000 mg | ORAL_TABLET | Freq: Every day | ORAL | Status: DC
  Administered 2014-05-28: 20 mg via ORAL
  Filled 2014-05-28 (×2): qty 1

## 2014-05-28 MED ORDER — 0.9 % SODIUM CHLORIDE (POUR BTL) OPTIME
TOPICAL | Status: DC | PRN
  Administered 2014-05-28: 1000 mL

## 2014-05-28 MED ORDER — FENTANYL CITRATE 0.05 MG/ML IJ SOLN
INTRAMUSCULAR | Status: DC | PRN
  Administered 2014-05-28: 100 ug via INTRAVENOUS

## 2014-05-28 MED ORDER — ACETAMINOPHEN 650 MG RE SUPP: 650.0000 mg | RECTAL | Status: DC | PRN

## 2014-05-28 MED ORDER — PROPOFOL 10 MG/ML IV BOLUS
INTRAVENOUS | Status: AC
  Filled 2014-05-28: qty 20

## 2014-05-28 MED ORDER — CARVEDILOL 25 MG PO TABS
25.0000 mg | ORAL_TABLET | Freq: Every day | ORAL | Status: DC
  Filled 2014-05-28: qty 1

## 2014-05-28 MED ORDER — FENTANYL CITRATE 0.05 MG/ML IJ SOLN
INTRAMUSCULAR | Status: AC
  Filled 2014-05-28: qty 5

## 2014-05-28 MED ORDER — PHENYLEPHRINE HCL 10 MG/ML IJ SOLN
INTRAMUSCULAR | Status: DC | PRN
  Administered 2014-05-28: 40 ug via INTRAVENOUS

## 2014-05-28 MED ORDER — MENTHOL 3 MG MT LOZG: 1.0000 | LOZENGE | OROMUCOSAL | Status: DC | PRN

## 2014-05-28 MED ORDER — SUCCINYLCHOLINE CHLORIDE 20 MG/ML IJ SOLN
INTRAMUSCULAR | Status: DC | PRN
  Administered 2014-05-28: 120 mg via INTRAVENOUS

## 2014-05-28 MED ORDER — LIDOCAINE HCL (CARDIAC) 20 MG/ML IV SOLN
INTRAVENOUS | Status: DC | PRN
  Administered 2014-05-28: 40 mg via INTRAVENOUS

## 2014-05-28 MED ORDER — FLEET ENEMA 7-19 GM/118ML RE ENEM
1.0000 | ENEMA | Freq: Once | RECTAL | Status: AC | PRN
  Filled 2014-05-28: qty 1

## 2014-05-28 MED ORDER — METHYLPREDNISOLONE ACETATE 80 MG/ML IJ SUSP
INTRAMUSCULAR | Status: DC | PRN
  Administered 2014-05-28: 80 mg

## 2014-05-28 MED ORDER — PROPOFOL 10 MG/ML IV BOLUS
INTRAVENOUS | Status: DC | PRN
  Administered 2014-05-28: 150 mg via INTRAVENOUS
  Administered 2014-05-28: 20 mg via INTRAVENOUS

## 2014-05-28 MED ORDER — LACTATED RINGERS IV SOLN
INTRAVENOUS | Status: DC | PRN
  Administered 2014-05-28 (×2): via INTRAVENOUS

## 2014-05-28 MED ORDER — EPHEDRINE SULFATE 50 MG/ML IJ SOLN
INTRAMUSCULAR | Status: DC | PRN
  Administered 2014-05-28: 5 mg via INTRAVENOUS

## 2014-05-28 MED ORDER — FENTANYL CITRATE 0.05 MG/ML IJ SOLN
25.0000 ug | INTRAMUSCULAR | Status: DC | PRN
Start: 2014-05-28 — End: 2014-05-28

## 2014-05-28 MED ORDER — LIDOCAINE-EPINEPHRINE 1 %-1:100000 IJ SOLN
INTRAMUSCULAR | Status: DC | PRN
  Administered 2014-05-28: 10 mL

## 2014-05-28 MED ORDER — CEFAZOLIN SODIUM 1-5 GM-% IV SOLN
1.0000 g | Freq: Three times a day (TID) | INTRAVENOUS | Status: DC
Start: 2014-05-29 — End: 2014-05-29
  Administered 2014-05-28: 1 g via INTRAVENOUS

## 2014-05-28 MED ORDER — FENTANYL CITRATE 0.05 MG/ML IJ SOLN
INTRAMUSCULAR | Status: AC
  Filled 2014-05-28: qty 2

## 2014-05-28 MED ORDER — SUCCINYLCHOLINE CHLORIDE 20 MG/ML IJ SOLN
INTRAMUSCULAR | Status: AC
  Filled 2014-05-28: qty 1

## 2014-05-28 MED ORDER — CEFAZOLIN SODIUM-DEXTROSE 2-3 GM-% IV SOLR
INTRAVENOUS | Status: AC
Start: 2014-05-28 — End: 2014-05-29
  Filled 2014-05-28: qty 50

## 2014-05-28 MED ORDER — LACTATED RINGERS IV SOLN
INTRAVENOUS | Status: DC
  Administered 2014-05-28: 50 mL/h via INTRAVENOUS

## 2014-05-28 MED ORDER — FLUTICASONE PROPIONATE 50 MCG/ACT NA SUSP
1.0000 | Freq: Every day | NASAL | Status: DC
  Administered 2014-05-28: 1 via NASAL
  Filled 2014-05-28: qty 16

## 2014-05-28 MED ORDER — NEOSTIGMINE METHYLSULFATE 10 MG/10ML IV SOLN
INTRAVENOUS | Status: DC | PRN
  Administered 2014-05-28: 5 mg via INTRAVENOUS

## 2014-05-28 MED ORDER — HEMOSTATIC AGENTS (NO CHARGE) OPTIME
TOPICAL | Status: DC | PRN
  Administered 2014-05-28: 1 via TOPICAL

## 2014-05-28 MED ORDER — PROMETHAZINE HCL 25 MG/ML IJ SOLN: 6.2500 mg | INTRAMUSCULAR | Status: DC | PRN

## 2014-05-28 MED ORDER — ONDANSETRON HCL 4 MG/2ML IJ SOLN
INTRAMUSCULAR | Status: AC
Start: 2014-05-28 — End: 2014-05-28
  Filled 2014-05-28: qty 2

## 2014-05-28 MED ORDER — ALUM & MAG HYDROXIDE-SIMETH 200-200-20 MG/5ML PO SUSP: 30.0000 mL | Freq: Four times a day (QID) | ORAL | Status: DC | PRN

## 2014-05-28 MED ORDER — FENTANYL CITRATE 0.05 MG/ML IJ SOLN
INTRAMUSCULAR | Status: DC | PRN
  Administered 2014-05-28 (×5): 50 ug via INTRAVENOUS

## 2014-05-28 MED ORDER — PANTOPRAZOLE SODIUM 40 MG PO TBEC
40.0000 mg | DELAYED_RELEASE_TABLET | Freq: Every day | ORAL | Status: DC
  Administered 2014-05-28: 40 mg via ORAL
  Filled 2014-05-28: qty 1

## 2014-05-28 MED ORDER — SENNA 8.6 MG PO TABS
1.0000 | ORAL_TABLET | Freq: Two times a day (BID) | ORAL | Status: DC
  Administered 2014-05-28: 8.6 mg via ORAL
  Filled 2014-05-28 (×3): qty 1

## 2014-05-28 MED ORDER — DIAZEPAM 5 MG PO TABS
5.0000 mg | ORAL_TABLET | Freq: Four times a day (QID) | ORAL | Status: DC | PRN
  Administered 2014-05-29: 5 mg via ORAL
  Filled 2014-05-28: qty 1

## 2014-05-28 MED ORDER — AMLODIPINE BESYLATE 5 MG PO TABS
5.0000 mg | ORAL_TABLET | Freq: Every day | ORAL | Status: DC
  Administered 2014-05-29: 5 mg via ORAL
  Filled 2014-05-28 (×2): qty 1

## 2014-05-28 MED ORDER — FUROSEMIDE 40 MG PO TABS
40.0000 mg | ORAL_TABLET | ORAL | Status: DC
  Filled 2014-05-28: qty 1

## 2014-05-28 MED ORDER — ROCURONIUM BROMIDE 100 MG/10ML IV SOLN
INTRAVENOUS | Status: DC | PRN
  Administered 2014-05-28: 40 mg via INTRAVENOUS

## 2014-05-28 MED ORDER — GABAPENTIN 300 MG PO CAPS
300.0000 mg | ORAL_CAPSULE | Freq: Three times a day (TID) | ORAL | Status: DC
  Administered 2014-05-28: 300 mg via ORAL
  Filled 2014-05-28 (×4): qty 1

## 2014-05-28 MED ORDER — SODIUM CHLORIDE 0.9 % IJ SOLN: 3.0000 mL | Freq: Two times a day (BID) | INTRAMUSCULAR | Status: DC

## 2014-05-28 MED ORDER — DOCUSATE SODIUM 100 MG PO CAPS
100.0000 mg | ORAL_CAPSULE | Freq: Two times a day (BID) | ORAL | Status: DC
  Administered 2014-05-28: 100 mg via ORAL
  Filled 2014-05-28 (×3): qty 1

## 2014-05-28 MED ORDER — ROCURONIUM BROMIDE 50 MG/5ML IV SOLN
INTRAVENOUS | Status: AC
  Filled 2014-05-28: qty 1

## 2014-05-28 MED ORDER — MIDAZOLAM HCL 2 MG/2ML IJ SOLN
INTRAMUSCULAR | Status: AC
Start: 2014-05-28 — End: 2014-05-28
  Filled 2014-05-28: qty 2

## 2014-05-28 SURGICAL SUPPLY — 64 items
BAG DECANTER FOR FLEXI CONT (MISCELLANEOUS) IMPLANT
BENZOIN TINCTURE PRP APPL 2/3 (GAUZE/BANDAGES/DRESSINGS) IMPLANT
BIT DRILL NEURO 2X3.1 SFT TUCH (MISCELLANEOUS) ×1 IMPLANT
BLADE SURG ROTATE 9660 (MISCELLANEOUS) ×2 IMPLANT
BUR ROUND FLUTED 5 RND (BURR) ×2 IMPLANT
CANISTER SUCT 3000ML (MISCELLANEOUS) ×2 IMPLANT
CONT SPEC 4OZ CLIKSEAL STRL BL (MISCELLANEOUS) ×2 IMPLANT
DERMABOND ADVANCED (GAUZE/BANDAGES/DRESSINGS) ×1
DERMABOND ADVANCED .7 DNX12 (GAUZE/BANDAGES/DRESSINGS) ×1 IMPLANT
DRAPE LAPAROTOMY 100X72X124 (DRAPES) ×2 IMPLANT
DRAPE MICROSCOPE LEICA (MISCELLANEOUS) ×2 IMPLANT
DRAPE POUCH INSTRU U-SHP 10X18 (DRAPES) ×2 IMPLANT
DRAPE PROXIMA HALF (DRAPES) ×2 IMPLANT
DRAPE SURG 17X23 STRL (DRAPES) ×2 IMPLANT
DRILL NEURO 2X3.1 SOFT TOUCH (MISCELLANEOUS) ×2
DRSG OPSITE POSTOP 3X4 (GAUZE/BANDAGES/DRESSINGS) ×2 IMPLANT
DRSG TELFA 3X8 NADH (GAUZE/BANDAGES/DRESSINGS) IMPLANT
DURAPREP 26ML APPLICATOR (WOUND CARE) ×2 IMPLANT
ELECT BLADE 4.0 EZ CLEAN MEGAD (MISCELLANEOUS) ×2
ELECT REM PT RETURN 9FT ADLT (ELECTROSURGICAL) ×2
ELECTRODE BLDE 4.0 EZ CLN MEGD (MISCELLANEOUS) ×1 IMPLANT
ELECTRODE REM PT RTRN 9FT ADLT (ELECTROSURGICAL) ×1 IMPLANT
GAUZE SPONGE 4X4 12PLY STRL (GAUZE/BANDAGES/DRESSINGS) IMPLANT
GAUZE SPONGE 4X4 16PLY XRAY LF (GAUZE/BANDAGES/DRESSINGS) IMPLANT
GLOVE BIO SURGEON STRL SZ8 (GLOVE) ×2 IMPLANT
GLOVE BIOGEL PI IND STRL 7.0 (GLOVE) ×1 IMPLANT
GLOVE BIOGEL PI IND STRL 7.5 (GLOVE) ×1 IMPLANT
GLOVE BIOGEL PI IND STRL 8 (GLOVE) ×1 IMPLANT
GLOVE BIOGEL PI IND STRL 8.5 (GLOVE) ×1 IMPLANT
GLOVE BIOGEL PI INDICATOR 7.0 (GLOVE) ×1
GLOVE BIOGEL PI INDICATOR 7.5 (GLOVE) ×1
GLOVE BIOGEL PI INDICATOR 8 (GLOVE) ×1
GLOVE BIOGEL PI INDICATOR 8.5 (GLOVE) ×1
GLOVE ECLIPSE 7.5 STRL STRAW (GLOVE) ×2 IMPLANT
GLOVE ECLIPSE 8.0 STRL XLNG CF (GLOVE) ×2 IMPLANT
GLOVE EXAM NITRILE LRG STRL (GLOVE) IMPLANT
GLOVE EXAM NITRILE MD LF STRL (GLOVE) ×2 IMPLANT
GLOVE EXAM NITRILE XL STR (GLOVE) IMPLANT
GLOVE EXAM NITRILE XS STR PU (GLOVE) IMPLANT
GLOVE SURG SS PI 7.0 STRL IVOR (GLOVE) ×4 IMPLANT
GOWN STRL REUS W/ TWL LRG LVL3 (GOWN DISPOSABLE) ×1 IMPLANT
GOWN STRL REUS W/ TWL XL LVL3 (GOWN DISPOSABLE) ×2 IMPLANT
GOWN STRL REUS W/TWL 2XL LVL3 (GOWN DISPOSABLE) ×2 IMPLANT
GOWN STRL REUS W/TWL LRG LVL3 (GOWN DISPOSABLE) ×1
GOWN STRL REUS W/TWL XL LVL3 (GOWN DISPOSABLE) ×2
KIT BASIN OR (CUSTOM PROCEDURE TRAY) ×2 IMPLANT
KIT ROOM TURNOVER OR (KITS) ×2 IMPLANT
NEEDLE HYPO 18GX1.5 BLUNT FILL (NEEDLE) ×2 IMPLANT
NEEDLE HYPO 25X1 1.5 SAFETY (NEEDLE) ×2 IMPLANT
NS IRRIG 1000ML POUR BTL (IV SOLUTION) ×2 IMPLANT
PACK LAMINECTOMY NEURO (CUSTOM PROCEDURE TRAY) ×2 IMPLANT
PAD ARMBOARD 7.5X6 YLW CONV (MISCELLANEOUS) ×8 IMPLANT
RUBBERBAND STERILE (MISCELLANEOUS) ×4 IMPLANT
SPONGE SURGIFOAM ABS GEL SZ50 (HEMOSTASIS) ×2 IMPLANT
STRIP CLOSURE SKIN 1/2X4 (GAUZE/BANDAGES/DRESSINGS) IMPLANT
SUT VIC AB 0 CT1 18XCR BRD8 (SUTURE) ×1 IMPLANT
SUT VIC AB 0 CT1 8-18 (SUTURE) ×1
SUT VIC AB 2-0 CT1 18 (SUTURE) ×2 IMPLANT
SUT VIC AB 3-0 SH 8-18 (SUTURE) ×2 IMPLANT
SYR 20ML ECCENTRIC (SYRINGE) ×2 IMPLANT
SYR 5ML LL (SYRINGE) ×2 IMPLANT
TOWEL OR 17X24 6PK STRL BLUE (TOWEL DISPOSABLE) ×2 IMPLANT
TOWEL OR 17X26 10 PK STRL BLUE (TOWEL DISPOSABLE) ×2 IMPLANT
WATER STERILE IRR 1000ML POUR (IV SOLUTION) ×2 IMPLANT

## 2014-05-28 NOTE — Anesthesia Procedure Notes (Signed)
Procedure Name: Intubation Date/Time: 05/28/2014 7:15 PM Performed by: Brien Mates D Pre-anesthesia Checklist: Patient identified, Emergency Drugs available, Suction available, Patient being monitored and Timeout performed Patient Re-evaluated:Patient Re-evaluated prior to inductionOxygen Delivery Method: Circle system utilized Preoxygenation: Pre-oxygenation with 100% oxygen Intubation Type: IV induction Ventilation: Mask ventilation without difficulty Laryngoscope Size: Miller and 2 Grade View: Grade I Tube type: Oral Tube size: 7.5 mm Number of attempts: 1 Airway Equipment and Method: Stylet Placement Confirmation: ETT inserted through vocal cords under direct vision,  positive ETCO2 and breath sounds checked- equal and bilateral Secured at: 23 cm Tube secured with: Tape Dental Injury: Teeth and Oropharynx as per pre-operative assessment

## 2014-05-28 NOTE — Plan of Care (Signed)
Problem: Consults Goal: Diagnosis - Spinal Surgery Outcome: Completed/Met Date Met:  05/28/14 Microdiscectomy

## 2014-05-28 NOTE — Op Note (Signed)
05/28/2014  8:06 PM  PATIENT:  Devin Rios  73 y.o. male  PRE-OPERATIVE DIAGNOSIS:  Lumbar hnp without myelopathy, Lumbar stenosis, lumbar spondylosis, lumbar radiculopathy L 34 Left  POST-OPERATIVE DIAGNOSIS:  Lumbar hnp without myelopathy, Lumbar stenosis, lumbar spondylosis, lumbar radiculopathy L 34 Left   PROCEDURE:  Procedure(s) with comments: Left Lumbar Three-Four Microdiskectomy (Left) - Left L3-4 Microdiskectomy with microdissection  SURGEON:  Surgeon(s) and Role:    * Edana Aguado, MD - Primary    * Neelesh Nundkumar, MD - Assisting  PHYSICIAN ASSISTANT:   ASSISTANTS: Poteat, RN   ANESTHESIA:   general  EBL:  Total I/O In: 1000 [I.V.:1000] Out: 50 [Blood:50]  BLOOD ADMINISTERED:none  DRAINS: none   LOCAL MEDICATIONS USED:  LIDOCAINE   SPECIMEN:  No Specimen  DISPOSITION OF SPECIMEN:  N/A  COUNTS:  YES  TOURNIQUET:  * No tourniquets in log *  DICTATION: Patient has an L 34 disc rupture on the left with significant left leg pain and weakness. It was elected to take him to surgery for left L 34 microdiscectomy.  Procedure: Patient was brought to the operating room and following the smooth and uncomplicated induction of general endotracheal anesthesia he was placed in a prone position on the Wilson frame. Low back was prepped and draped in the usual sterile fashion with betadine scrub and DuraPrep. Area of planned incision was infiltrated with local lidocaine. Incision was made in the midline and carried to the lumbodorsal fascia which was incised on the left side of midline. Subperiosteal dissection was performed exposing what was felt to be L34 level. Intraoperative x-ray demonstrated marker probes at L 23. Exposure was carried caudad to expose the L 34 level. A hemi-semi-laminectomy of L3 was performed a high-speed drill and completed with Kerrison rongeurs and a generous foraminotomy was performed overlying the superior aspect of the L4 lamina. Ligamentum  flavum was detached and removed in a piecemeal fashion and the L4nerve root was decompressed laterally with removal of the superior aspect of the facet and ligamentum causing nerve root compression. The microscope was brought into the field and the L4 nerve root was mobilized medially. This exposed soft disc material and a small fragment of herniated disc material. Multiple fragments were removed and these extended into the interspace which appeared to be quite soft with a disrupted annulus overlying the interspace. As a result it was elected to further decompress the interspace and remove loose disc material and this was done with a variety Epstein curettes and pituitary rongeurs. The redundant annulus was also removed with 2 mm Kerrison rongeur.  At this point it was felt that all neural elements were well decompressed and there was no evidence of residual loose disc material within the interspace. The interspace was then irrigated with saline and no additional disc material was mobilized. Hemostasis was assured with bipolar electrocautery and the interspace was irrigated with Depo-Medrol and fentanyl. The lumbodorsal fascia was closed with 0 Vicryl sutures the subcutaneous tissues reapproximated 2-0 Vicryl inverted sutures and the skin edges were reapproximated with 3-0 Vicryl subcuticular stitch. The wound is dressed with Dermabond. Patient was extubated in the operating room and taken to recovery in stable and satisfactory condition having tolerated his operation well counts were correct at the end of the case.  PLAN OF CARE: Admit for overnight observation  PATIENT DISPOSITION:  PACU - hemodynamically stable.   Delay start of Pharmacological VTE agent (>24hrs) due to surgical blood loss or risk of bleeding: yes  

## 2014-05-28 NOTE — H&P (Signed)
> [address] [place], [place] [address] Phone: [phone]   Patient ID:   [id number] Patient: Devin Rios  Date of Birth: September 15, 1941 Visit Type: Office Visit   Date: 05/10/2014 11:30 AM Provider: Danae Orleans. Venetia Maxon MD   This 73 year old male presents for back pain and Leg pain.  History of Present Illness: 1.  back pain  2.  Leg pain  [name], 73y.o. retired male, visits reporting recurrence of lumbar/left buttock/left thigh pain in May.  Hx of back injury in 1984 required lumbar surgery by Dr. Roxan Hockey. Initially in May, pain was sudden onset while supine, BLE & low back.  Recently, pain settled in left lumbar & left hip/thigh.  Gabapentin  TID Norco 10/325 ran out  Hx: HTN, NIDDM, AFIB (Rx Eliquis), recurrent UTI's (Rx Keflex) SxHx: Tonsils '66; Low back '84; hemorrhoid '87  MRI, X-ray on [facility]  Patient currently describes lumbar and left buttock and left thigh pain up to 7-8 out of 10 in severity.  He initially injured himself while replacing attire in September 1984 and at surgery after that.  Dr. Roxan Hockey did surgery and based on my review this appears to a bin at the L4 L5 level.  The patient had a right foot drop which got better after the surgery.  He says that he sits without pain but walking causes him significant pain.  He is currently taking Elequis for atrial fibrillation.  Dr. Donnie Aho is his cardiologist.  The patient currently describes that his pain is severe and that he has pain with sleeping.  He says he's better lying in a recliner and also he has pain when he lays down on the right.        PAST MEDICAL/SURGICAL HISTORY   (Detailed)  Disease/disorder Onset Date Management Date Comments    Tonsillectomy 1966     Surgery, lumbar spine 1984   Hypertension          PAST MEDICAL HISTORY, SURGICAL HISTORY, FAMILY HISTORY, SOCIAL HISTORY AND REVIEW OF SYSTEMS I have reviewed the patient's past medical, surgical, family  and social history as well as the comprehensive review of systems as included on the [facility] history form dated 05/10/2014, which I have signed.  Family History  (Detailed)  Relationship Family Member Name Deceased Age at Death Condition Onset Age Cause of Death      Family history of Hypertension  N      Family history of Diabetes mellitus  N  Mother    Stroke  N   SOCIAL HISTORY  (Detailed) Tobacco use reviewed. Preferred language is Unknown.   Smoking status: Never smoker.  SMOKING STATUS Use Status Type Smoking Status Usage Per Day Years Used Total Pack Years  no/never  Never smoker       HOME ENVIRONMENT/SAFETY The patient has not fallen in the last year.        MEDICATIONS(added, continued or stopped this visit):   Started Medication Directions Instruction Stopped   Aldactone 25 mg tablet take 1 tablet by oral route  every day     amlodipine 5 mg tablet take 1 tablet by oral route  every day     carvedilol 25 mg tablet take 1 tablet by oral route 2 times every day with food     cephalexin 500 mg tablet take 1 tablet by oral route 3 times every day for UTI     Eliquis 2.5 mg tablet take 1 tablet by oral  route 2 times every day     gabapentin 300 mg capsule take 1 capsule by oral route 3 times every day     Glucotrol XL 10 mg tablet,extended release take 1 tablet by oral route  every day with breakfast     Lasix 40 mg tablet take 1 tablet by oral route  every day     Lotensin 20 mg tablet take 1 tablet by oral route  every day     Norco 10 mg-325 mg tablet take 1 tablet by oral route  every 8 hours as needed for pain  05/10/2014  05/10/2014 Norco 10 mg-325 mg tablet take 1 tablet by oral route  every 8 hours as needed for pain      ALLERGIES:  Ingredient Reaction Medication Name Comment  CODEINE Stomach Pain    Reviewed, updated.  REVIEW OF SYSTEMS System Neg/Pos Details  Constitutional Negative Chills, fatigue, fever, malaise,  night sweats, weight gain and weight loss.  ENMT Negative Ear drainage, hearing loss, nasal drainage, otalgia, sinus pressure and sore throat.  Eyes Negative Eye discharge, eye pain and vision changes.  Respiratory Negative Chronic cough, cough, dyspnea, known TB exposure and wheezing.  Cardio Negative Chest pain, claudication, edema and irregular heartbeat/palpitations.  GI Negative Abdominal pain, blood in stool, change in stool pattern, constipation, decreased appetite, diarrhea, heartburn, nausea and vomiting.  GU Negative Dribbling, dysuria, erectile dysfunction, hematuria, polyuria, slow stream, urinary frequency, urinary incontinence and urinary retention.  Endocrine Negative Cold intolerance, heat intolerance, polydipsia and polyphagia.  Neuro Negative Dizziness, extremity weakness, gait disturbance, headache, memory impairment, numbness in extremity, seizures and tremors.  Psych Negative Anxiety, depression and insomnia.  Integumentary Negative Brittle hair, brittle nails, change in shape/size of mole(s), hair loss, hirsutism, hives, pruritus, rash and skin lesion.  MS Positive Back pain, LLE pain.  Hema/Lymph Negative Easy bleeding, easy bruising and lymphadenopathy.  Allergic/Immuno Negative Contact allergy, environmental allergies, food allergies and seasonal allergies.  Reproductive Negative Penile discharge and sexual dysfunction.    Vitals Date Temp F BP Pulse Ht In Wt Lb BMI BSA Pain Score  05/10/2014  126/86 84 71 304 42.4  7/10     PHYSICAL EXAM General Level of Distress: no acute distress Overall Appearance: obese  Head and Face  Right Left  Fundoscopic Exam:  normal normal    Cardiovascular Cardiac: regular rate and rhythm without murmur  Respiratory Lungs: clear to auscultation  Neurological Recent and Remote Memory: normal Attention Span and Concentration:   normal Language: normal Fund of Knowledge: normal  Right Left Sensation: normal normal Upper  Extremity Coordination: normal normal  Lower Extremity Coordination: normal normal  Musculoskeletal Gait and Station: normal  Right Left Upper Extremity Muscle Strength: normal normal Lower Extremity Muscle Strength: normal normal Upper Extremity Muscle Tone:  normal normal Lower Extremity Muscle Tone: normal normal  Motor Strength Upper and lower extremity motor strength was tested in the clinically pertinent muscles. Any abnormal findings will be noted below.   Right Left Knee Extensor:  4+/5   Deep Tendon Reflexes  Right Left Biceps: normal normal Triceps: normal normal Brachiloradialis: normal normal Patellar: normal normal Achilles: normal normal  Sensory Sensation was tested at L1 to S1.   Cranial Nerves II. Optic Nerve/Visual Fields: normal III. Oculomotor: normal IV. Trochlear: normal V. Trigeminal: normal VI. Abducens: normal VII. Facial: normal VIII. Acoustic/Vestibular: normal IX. Glossopharyngeal: normal X. Vagus: normal XI. Spinal Accessory: normal XII. Hypoglossal: normal  Motor and other Tests Lhermittes: negative Rhomberg: negative  Right Left Hoffman's: normal normal Clonus: normal normal Babinski: normal normal SLR: negative positive at 40 degrees Patrick's Pearlean Brownie): negative negative Toe Walk: normal normal Toe Lift: normal normal Heel Walk: normal normal SI Joint: nontender nontender   Additional Findings:  Patient has left sciatic notch and left paravertebral pain to palpation.  He is able to stand on his heels and toes but has difficulty squatting on his left leg.  He is able to bend within 8 inches of the floor with his upper extremities outstretched.    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  Diagnostic report text  CLINICAL DATA: Low back pain. Left  posterior buttock pain and left thigh pain  EXAM: MRI LUMBAR SPINE WITHOUT CONTRAST  TECHNIQUE: Multiplanar, multisequence MR imaging of the lumbar spine was performed. No intravenous contrast was administered.  COMPARISON: Lumbar MRI 12/11/2010  FINDINGS: Normal lumbar alignment with straightening of the lumbar lordosis. Negative for fracture or mass. Conus medullaris is normal and terminates at L1-2.  L1-2: Negative  L2-3: Shallow central disc protrusion is unchanged. Early facet arthrosis with mild spinal stenosis.  L3-4: Interval improvement in central disc protrusion and spinal stenosis. Moderate central canal stenosis is present with interval improvement. There is bilateral facet arthrosis. Neural foramina are patent.  L4-5: Disc degeneration and spondylosis. Fatty endplate changes are present. There is facet hypertrophy bilaterally. Moderate spinal stenosis. On axial images the spinal stenosis appears slightly more prominent however no new disc protrusion is identified.  L5-S1: Normal disc space. Bilateral facet hypertrophy without stenosis.  IMPRESSION: L2-3 central disc protrusion stable  Central disc protrusion L3-4 has improved. There remains moderate spinal stenosis at this level which has improved  Disc degeneration and spondylosis at L4-5 appears similar in degree. Central canal stenosis appears to have progressed slightly however no new disc protrusion identified.   Electronically Signed By: Marlan Palau M.D. On: 03/12/2014 08:56    IMPRESSION Despite the radiologist's impression of improvement at the L3 L4 disc herniation, to my review there appears to be more left-sided neural foraminal and lateral recess stenosis due to the herniated disc.  This is better in the center of the spinal canal but worse on the left side which I think is entirely in keeping with the patient's pain complaints.  Completed Orders (this encounter) Order Details Reason  Side Interpretation Result Initial Treatment Date Region  Lumbar Spine- AP/Lat/Flex/Ex      05/10/2014 All Levels to All Levels   Assessment/Plan # Detail Type Description   1. 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       2. Assessment Lumbar spondylosis (721.3).       3. Assessment Lumbago (724.2).       4. Assessment Lumbar radiculopathy (724.4).         Pain Assessment/Treatment Pain Scale: 7/10. Method: Numeric Pain Intensity Scale. Location: back/left leg. Onset: 04/19/2014. Duration: varies. Quality: sharp, shooting. Pain Assessment/Treatment follow-up plan of care: Patient is not currently taking any medication for pain..  Fall Risk Plan The patient has not fallen in the last year.  The patient will be cleared for surgery by Dr. Donnie Aho and upon being cleared we will proceed with a left L3 L4 microdiscectomy.  Risks and benefits were discussed in detail with the patient and he wishes to proceed with surgery.  Orders: Diagnostic Procedures: Assessment Procedure   Lumbar Spine- AP/Lat/Flex/Ex  MEDICATIONS PRESCRIBED TODAY    Rx Quantity Refills  NORCO 10 mg-325 mg  90 0            Provider:  Danae Orleans. Venetia Maxon MD  05/15/2014 06:04 PM Dictation edited by: Danae Orleans. [facility]    CC Providers: [facility] [id number] [place], [place] [address]- ----------------------------------------------------------------------------------------------------------------------------------------------------------------------         Electronically signed by Danae Orleans. Venetia Maxon MD on 05/15/2014 06:04 PM

## 2014-05-28 NOTE — Brief Op Note (Signed)
05/28/2014  8:06 PM  PATIENT:  Devin Rios  73 y.o. male  PRE-OPERATIVE DIAGNOSIS:  Lumbar hnp without myelopathy, Lumbar stenosis, lumbar spondylosis, lumbar radiculopathy L 34 Left  POST-OPERATIVE DIAGNOSIS:  Lumbar hnp without myelopathy, Lumbar stenosis, lumbar spondylosis, lumbar radiculopathy L 34 Left   PROCEDURE:  Procedure(s) with comments: Left Lumbar Three-Four Microdiskectomy (Left) - Left L3-4 Microdiskectomy with microdissection  SURGEON:  Surgeon(s) and Role:    * Maeola Harman, MD - Primary    * Lisbeth Renshaw, MD - Assisting  PHYSICIAN ASSISTANT:   ASSISTANTS: Poteat, RN   ANESTHESIA:   general  EBL:  Total I/O In: 1000 [I.V.:1000] Out: 50 [Blood:50]  BLOOD ADMINISTERED:none  DRAINS: none   LOCAL MEDICATIONS USED:  LIDOCAINE   SPECIMEN:  No Specimen  DISPOSITION OF SPECIMEN:  N/A  COUNTS:  YES  TOURNIQUET:  * No tourniquets in log *  DICTATION: Patient has an L 34 disc rupture on the left with significant left leg pain and weakness. It was elected to take him to surgery for left L 34 microdiscectomy.  Procedure: Patient was brought to the operating room and following the smooth and uncomplicated induction of general endotracheal anesthesia he was placed in a prone position on the Wilson frame. Low back was prepped and draped in the usual sterile fashion with betadine scrub and DuraPrep. Area of planned incision was infiltrated with local lidocaine. Incision was made in the midline and carried to the lumbodorsal fascia which was incised on the left side of midline. Subperiosteal dissection was performed exposing what was felt to be L34 level. Intraoperative x-ray demonstrated marker probes at L 23. Exposure was carried caudad to expose the L 34 level. A hemi-semi-laminectomy of L3 was performed a high-speed drill and completed with Kerrison rongeurs and a generous foraminotomy was performed overlying the superior aspect of the L4 lamina. Ligamentum  flavum was detached and removed in a piecemeal fashion and the L4nerve root was decompressed laterally with removal of the superior aspect of the facet and ligamentum causing nerve root compression. The microscope was brought into the field and the L4 nerve root was mobilized medially. This exposed soft disc material and a small fragment of herniated disc material. Multiple fragments were removed and these extended into the interspace which appeared to be quite soft with a disrupted annulus overlying the interspace. As a result it was elected to further decompress the interspace and remove loose disc material and this was done with a variety Epstein curettes and pituitary rongeurs. The redundant annulus was also removed with 2 mm Kerrison rongeur.  At this point it was felt that all neural elements were well decompressed and there was no evidence of residual loose disc material within the interspace. The interspace was then irrigated with saline and no additional disc material was mobilized. Hemostasis was assured with bipolar electrocautery and the interspace was irrigated with Depo-Medrol and fentanyl. The lumbodorsal fascia was closed with 0 Vicryl sutures the subcutaneous tissues reapproximated 2-0 Vicryl inverted sutures and the skin edges were reapproximated with 3-0 Vicryl subcuticular stitch. The wound is dressed with Dermabond. Patient was extubated in the operating room and taken to recovery in stable and satisfactory condition having tolerated his operation well counts were correct at the end of the case.  PLAN OF CARE: Admit for overnight observation  PATIENT DISPOSITION:  PACU - hemodynamically stable.   Delay start of Pharmacological VTE agent (>24hrs) due to surgical blood loss or risk of bleeding: yes

## 2014-05-28 NOTE — Anesthesia Preprocedure Evaluation (Addendum)
Anesthesia Evaluation  Patient identified by MRN, date of birth, ID band Patient awake    Reviewed: Allergy & Precautions, H&P , NPO status , Patient's Chart, lab work & pertinent test results  Airway Mallampati: II TM Distance: >3 FB Neck ROM: Full    Dental  (+) Poor Dentition, Dental Advisory Given   Pulmonary Sleep apnea: positive sleep test 72AH/hr, reduced with Bipap, but Pt not using regularly, CBC not overly concentrated. , former smoker (quit 1973, 42 pack year hx),          Cardiovascular hypertension, Pt. on medications + dysrhythmias (cardiologist ok with being off anticoags for surgery,  cleared by cardiologist Dr. Karleen Hampshire, EF 45% stress test with fixed defect and also shows EF 45%) Atrial Fibrillation     Neuro/Psych    GI/Hepatic   Endo/Other  diabetes, Well Controlled, Type 2, Oral Hypoglycemic AgentsMorbid obesity  Renal/GU Renal InsufficiencyRenal diseaseCreat 1.5 GFR 55     Musculoskeletal   Abdominal   Peds  Hematology   Anesthesia Other Findings   Reproductive/Obstetrics                         Anesthesia Physical Anesthesia Plan  ASA: III  Anesthesia Plan: General   Post-op Pain Management:    Induction: Intravenous and Rapid sequence  Airway Management Planned: Oral ETT  Additional Equipment:   Intra-op Plan:   Post-operative Plan: Extubation in OR  Informed Consent: I have reviewed the patients History and Physical, chart, labs and discussed the procedure including the risks, benefits and alternatives for the proposed anesthesia with the patient or authorized representative who has indicated his/her understanding and acceptance.   Dental advisory given  Plan Discussed with: CRNA, Anesthesiologist and Surgeon  Anesthesia Plan Comments:        Anesthesia Quick Evaluation

## 2014-05-28 NOTE — Transfer of Care (Signed)
Immediate Anesthesia Transfer of Care Note  Patient: Devin Rios  Procedure(s) Performed: Procedure(s) with comments: Left Lumbar Three-Four Microdiskectomy (Left) - Left L3-4 Microdiskectomy  Patient Location: PACU  Anesthesia Type:General  Level of Consciousness: sedated  Airway & Oxygen Therapy: Patient Spontanous Breathing and Patient connected to face mask oxygen  Post-op Assessment: Report given to PACU RN and Post -op Vital signs reviewed and stable  Post vital signs: Reviewed and stable  Complications: No apparent anesthesia complications

## 2014-05-29 DIAGNOSIS — M5126 Other intervertebral disc displacement, lumbar region: Secondary | ICD-10-CM | POA: Diagnosis not present

## 2014-05-29 LAB — GLUCOSE, CAPILLARY: Glucose-Capillary: 194 mg/dL — ABNORMAL HIGH (ref 70–99)

## 2014-05-29 MED ORDER — HYDROCODONE-ACETAMINOPHEN 10-325 MG PO TABS: 1.0000 | ORAL_TABLET | ORAL | Status: DC | PRN

## 2014-05-29 NOTE — Progress Notes (Signed)
OT Cancellation Note  Patient Details Name: Devin Rios MRN: [id number] DOB: 16-Sep-1941   Cancelled Treatment:    Reason Eval/Treat Not Completed: OT screened, no needs identified, will sign off  [facility], OTR/L  [phone] 05/29/2014 05/29/2014, 8:26 AM

## 2014-05-29 NOTE — Progress Notes (Signed)
Patient alert and oriented, mae's well, voiding adequate amount of urine, swallowing without difficulty, no c/o pain. Patient discharged home with family. Script and discharged instructions given to patient. Patient and family stated understanding of instructions given.  

## 2014-05-29 NOTE — Evaluation (Signed)
Physical Therapy Evaluation and Discharge  Patient Details Name: Devin Rios MRN: [id number] DOB: Feb 14, 1941 Today's Date: 05/29/2014   History of Present Illness  s/p Left L3-4 Microdiskectomy with microdissection 05/28/14.  Clinical Impression  Patient evaluated by Physical Therapy with no further acute PT needs identified. All education has been completed and the patient has no further questions.  See below for any follow-up Physial Therapy or equipment needs. PT is signing off. Thank you for this referral.    Follow Up Recommendations No PT follow up    Equipment Recommendations  None recommended by PT    Recommendations for Other Services       Precautions / Restrictions Precautions Precautions: Back Precaution Booklet Issued: Yes (comment) Precaution Comments: given handout and reviewed precautions with pt Restrictions Weight Bearing Restrictions: No      Mobility  Bed Mobility Overal bed mobility: Modified Independent             General bed mobility comments: reviewed log rolling technique; verbalized understanding   Transfers Overall transfer level: Modified independent Equipment used: None             General transfer comment: incr time; no physical (A) needed   Ambulation/Gait Ambulation/Gait assistance: Modified independent (Device/Increase time) Ambulation Distance (Feet): 180 Feet Assistive device: None Gait Pattern/deviations: Decreased stance time - right;Decreased step length - left;Antalgic;Wide base of support Gait velocity: decr due to chronic pain in rt LE  Gait velocity interpretation: Below normal speed for age/gender General Gait Details: antalgic gt due to chronic knee pain; no LOB noted; cues for back precautions with ambulation and direction changes   Stairs Stairs: Yes Stairs assistance: Modified independent (Device/Increase time) Stair Management: One rail Right;Alternating pattern;Forwards Number of Stairs: 3 General  stair comments: demo good technique   Wheelchair Mobility    Modified Rankin (Stroke Patients Only)       Balance Overall balance assessment: No apparent balance deficits (not formally assessed)                                           Pertinent Vitals/Pain Pain Assessment: 0-10 Pain Score: 4  Pain Location: surgical site  Pain Descriptors / Indicators: Aching Pain Intervention(s): Premedicated before session;Monitored during session    Home Living Family/patient expects to be discharged to:: Private residence Living Arrangements: Spouse/significant other Available Help at Discharge: Family;Available 24 hours/day Type of Home: House Home Access: Stairs to enter Entrance Stairs-Rails: Right Entrance Stairs-Number of Steps: 2 Home Layout: One level Home Equipment: Walker - 2 wheels;Cane - single point;Bedside commode;Grab bars - toilet;Grab bars - tub/shower      Prior Function Level of Independence: Independent with assistive device(s)         Comments: cane as needed     Hand Dominance        Extremity/Trunk Assessment   Upper Extremity Assessment: Overall WFL for tasks assessed           Lower Extremity Assessment: Generalized weakness (denied any numbness; tingling )      Cervical / Trunk Assessment: Normal  Communication   Communication: No difficulties  Cognition Arousal/Alertness: Awake/alert Behavior During Therapy: WFL for tasks assessed/performed Overall Cognitive Status: Within Functional Limits for tasks assessed                      General Comments General comments (skin integrity,  edema, etc.): educated on self care strategies and car transfer technique     Exercises        Assessment/Plan    PT Assessment Patent does not need any further PT services  PT Diagnosis     PT Problem List    PT Treatment Interventions     PT Goals (Current goals can be found in the Care Plan section) Acute Rehab PT  Goals PT Goal Formulation: No goals set, d/c therapy    Frequency     Barriers to discharge        Co-evaluation               End of Session   Activity Tolerance: Patient tolerated treatment well Patient left: in bed;with call bell/phone within reach Nurse Communication: Mobility status    Functional Assessment Tool Used: clinical judgement  Functional Limitation: Mobility: Walking and moving around Mobility: Walking and Moving Around Current Status ([id number]): 0 percent impaired, limited or restricted Mobility: Walking and Moving Around Goal Status [id number]): 0 percent impaired, limited or restricted Mobility: Walking and Moving Around Discharge Status [phone]): 0 percent impaired, limited or restricted    Time: 0815-0824 PT Time Calculation (min): 9 min   Charges:   PT Evaluation $Initial PT Evaluation Tier I: 1 Procedure PT Treatments $Gait Training: 8-22 mins   PT G Codes:   Functional Assessment Tool Used: clinical judgement  Functional Limitation: Mobility: Walking and moving around    [place], Evans, Lynn  [phone] 05/29/2014, 8:37 AM

## 2014-05-29 NOTE — Progress Notes (Signed)
UR Completed.  Devin Rios [phone] 05/29/2014  

## 2014-05-29 NOTE — Discharge Instructions (Signed)

## 2014-05-29 NOTE — Anesthesia Postprocedure Evaluation (Signed)
  Anesthesia Post-op Note  Patient: Devin Rios  Procedure(s) Performed: Procedure(s) with comments: Left Lumbar Three-Four Microdiskectomy (Left) - Left L3-4 Microdiskectomy  Patient Location: PACU  Anesthesia Type:General  Level of Consciousness: awake  Airway and Oxygen Therapy: Patient Spontanous Breathing  Post-op Pain: mild  Post-op Assessment: Post-op Vital signs reviewed  Post-op Vital Signs: Reviewed  Last Vitals:  Filed Vitals:   05/28/14 2312  BP: 118/81  Pulse: 98  Temp: 37 C  Resp: 18    Complications: No apparent anesthesia complications

## 2014-05-29 NOTE — Discharge Summary (Signed)
Physician Discharge Summary  Patient ID: Devin Rios MRN: [id number] DOB/AGE: 1941/03/20 73 y.o.  Admit date: 05/28/2014 Discharge date: 05/29/2014  Admission Diagnoses: Lumbar disc herniation   Discharge Diagnoses: Same   Discharged Condition: good  Hospital Course: The patient was admitted on 05/28/2014 and taken to the operating room where the patient underwent microdiscectomy. The patient tolerated the procedure well and was taken to the recovery room and then to the floor in stable condition. The hospital course was routine. There were no complications. The wound remained clean dry and intact. Pt had appropriate back soreness. No complaints of leg pain or new N/T/W. The patient remained afebrile with stable vital signs, and tolerated a regular diet. The patient continued to increase activities, and pain was well controlled with oral pain medications.   Consults: None  Significant Diagnostic Studies:  Results for orders placed during the hospital encounter of 05/28/14  GLUCOSE, CAPILLARY      Result Value Ref Range   Glucose-Capillary 144 (*) 70 - 99 mg/dL  GLUCOSE, CAPILLARY      Result Value Ref Range   Glucose-Capillary 101 (*) 70 - 99 mg/dL  GLUCOSE, CAPILLARY      Result Value Ref Range   Glucose-Capillary 116 (*) 70 - 99 mg/dL   Comment 1 Documented in Chart     Comment 2 Notify RN      Dg Chest 2 View  05/24/2014   CLINICAL DATA:  Pre operative respiratory exam. Lumbar disc protrusions.  EXAM: CHEST  2 VIEW  COMPARISON:  None.  FINDINGS: The heart size and mediastinal contours are within normal limits. Both lungs are clear. Bold fracture of the distal left clavicle.  IMPRESSION: No active cardiopulmonary disease.   Electronically Signed   By: Geanie Cooley M.D.   On: 05/24/2014 14:35   Dg Lumbar Spine 2-3 Views  05/28/2014   CLINICAL DATA:  L3-4 microdiscectomy  EXAM: LUMBAR SPINE - 2-3 VIEW  COMPARISON:  03/12/2014  FINDINGS: Five lumbar type vertebral bodies are  well visualized. Disc space narrowing is noted at L4-5 similar to that seen on the prior exam. Surgical instruments are noted in the posterior soft tissues at the L3-4 interspace. The numbering nomenclature similar to that utilized on the prior MRI examination.   Electronically Signed   By: Alcide Clever M.D.   On: 05/28/2014 19:39    Antibiotics:  Anti-infectives   Start     Dose/Rate Route Frequency Ordered Stop   05/29/14 0000  ceFAZolin (ANCEF) IVPB 1 g/50 mL premix     1 g 100 mL/hr over 30 Minutes Intravenous Every 8 hours 05/28/14 2127 05/29/14 1559   05/28/14 1841  ceFAZolin (ANCEF) 2-3 GM-% IVPB SOLR    Comments:  Brien Mates   : cabinet override      05/28/14 1841 05/29/14 0644   05/28/14 1841  ceFAZolin (ANCEF) 1-5 GM-% IVPB    Comments:  Brien Mates   : cabinet override      05/28/14 1841 05/29/14 0644   05/28/14 0600  ceFAZolin (ANCEF) 3 g in dextrose 5 % 50 mL IVPB     3 g 160 mL/hr over 30 Minutes Intravenous On call to O.R. 05/27/14 1406 05/28/14 1905      Discharge Exam: Blood pressure 115/78, pulse 85, temperature 98.6 F (37 C), temperature source Oral, resp. rate 18, height 5' 11.5" (1.816 m), weight 135.626 kg (299 lb), SpO2 91.00%. Neurologic: Grossly normal Incision clean dry and  Discharge Medications:  Medication List         amLODipine 5 MG tablet  Commonly known as:  NORVASC  Take 5 mg by mouth daily before breakfast.     benazepril 20 MG tablet  Commonly known as:  LOTENSIN  Take 20 mg by mouth daily.     carvedilol 25 MG tablet  Commonly known as:  COREG  Take 25 mg by mouth daily.     docusate sodium 100 MG capsule  Commonly known as:  COLACE  Take 100 mg by mouth daily as needed for mild constipation.     ELIQUIS 2.5 MG Tabs tablet  Generic drug:  apixaban  Take 2.5 mg by mouth 2 (two) times daily.     fluticasone 50 MCG/ACT nasal spray  Commonly known as:  FLONASE  Place 1 spray into both nostrils daily.     furosemide  40 MG tablet  Commonly known as:  LASIX  Take 40 mg by mouth every other day.     gabapentin 300 MG capsule  Commonly known as:  NEURONTIN  Take 300 mg by mouth 3 (three) times daily.     glipiZIDE 10 MG tablet  Commonly known as:  GLUCOTROL  Take 10 mg by mouth daily.     HYDROcodone-acetaminophen 10-325 MG per tablet  Commonly known as:  NORCO  Take 1 tablet by mouth every 4 (four) hours as needed (pain).     spironolactone 25 MG tablet  Commonly known as:  ALDACTONE  Take 25 mg by mouth daily.     Vitamin D3 5000 UNITS Caps  Take 5,000 Units by mouth every other day.        Disposition: Home   Final Dx: Microdiscectomy      Discharge Instructions   Call MD for:  difficulty breathing, headache or visual disturbances    Complete by:  As directed      Call MD for:  persistant nausea and vomiting    Complete by:  As directed      Call MD for:  redness, tenderness, or signs of infection (pain, swelling, redness, odor or green/yellow discharge around incision site)    Complete by:  As directed      Call MD for:  severe uncontrolled pain    Complete by:  As directed      Call MD for:  temperature >100.4    Complete by:  As directed      Diet - low sodium heart healthy    Complete by:  As directed      Discharge instructions    Complete by:  As directed   No bending or twisting, no driving, no strenuous activity, may shower     Increase activity slowly    Complete by:  As directed      Remove dressing in 48 hours    Complete by:  As directed            Follow-up Information   Follow up with STERN,JOSEPH D, MD. Schedule an appointment as soon as possible for a visit in 2 weeks.   Specialty:  Neurosurgery   Contact information:   [address] [place] [address] [phone]        Signed: Tia Alert 05/29/2014, 7:16 AM

## 2014-06-01 ENCOUNTER — Encounter (HOSPITAL_COMMUNITY): Payer: Self-pay | Admitting: Neurosurgery

## 2014-10-07 DIAGNOSIS — E559 Vitamin D deficiency, unspecified: Secondary | ICD-10-CM

## 2014-10-07 DIAGNOSIS — E1129 Type 2 diabetes mellitus with other diabetic kidney complication: Secondary | ICD-10-CM

## 2014-10-07 HISTORY — DX: Vitamin D deficiency, unspecified: E55.9

## 2014-10-07 HISTORY — DX: Type 2 diabetes mellitus with other diabetic kidney complication: E11.29

## 2015-03-14 DIAGNOSIS — F5101 Primary insomnia: Secondary | ICD-10-CM | POA: Insufficient documentation

## 2015-03-14 HISTORY — DX: Primary insomnia: F51.01

## 2015-08-08 DIAGNOSIS — E1122 Type 2 diabetes mellitus with diabetic chronic kidney disease: Secondary | ICD-10-CM | POA: Insufficient documentation

## 2015-08-08 DIAGNOSIS — N183 Chronic kidney disease, stage 3 unspecified: Secondary | ICD-10-CM | POA: Insufficient documentation

## 2015-08-08 HISTORY — DX: Chronic kidney disease, stage 3 unspecified: N18.30

## 2015-08-08 HISTORY — DX: Type 2 diabetes mellitus with diabetic chronic kidney disease: E11.22

## 2015-12-27 DIAGNOSIS — R300 Dysuria: Secondary | ICD-10-CM

## 2015-12-27 HISTORY — DX: Dysuria: R30.0

## 2016-02-21 DIAGNOSIS — M7741 Metatarsalgia, right foot: Secondary | ICD-10-CM

## 2016-02-21 DIAGNOSIS — M2040 Other hammer toe(s) (acquired), unspecified foot: Secondary | ICD-10-CM

## 2016-02-21 DIAGNOSIS — B351 Tinea unguium: Secondary | ICD-10-CM

## 2016-02-21 DIAGNOSIS — M7742 Metatarsalgia, left foot: Secondary | ICD-10-CM | POA: Insufficient documentation

## 2016-02-21 HISTORY — DX: Other hammer toe(s) (acquired), unspecified foot: M20.40

## 2016-02-21 HISTORY — DX: Metatarsalgia, right foot: M77.41

## 2016-02-21 HISTORY — DX: Tinea unguium: B35.1

## 2016-04-02 ENCOUNTER — Other Ambulatory Visit: Payer: Self-pay | Admitting: Neurosurgery

## 2016-04-06 ENCOUNTER — Encounter (HOSPITAL_COMMUNITY): Payer: Self-pay

## 2016-04-06 ENCOUNTER — Encounter (HOSPITAL_COMMUNITY)
Admission: RE | Admit: 2016-04-06 | Discharge: 2016-04-06 | Disposition: A | Discharge status: Home / Self Care | Payer: Medicare Other | Source: Ambulatory Visit | Attending: Neurosurgery | Admitting: Neurosurgery

## 2016-04-06 ENCOUNTER — Other Ambulatory Visit (HOSPITAL_COMMUNITY): Payer: Self-pay | Admitting: *Deleted

## 2016-04-06 DIAGNOSIS — Z7984 Long term (current) use of oral hypoglycemic drugs: Secondary | ICD-10-CM | POA: Diagnosis not present

## 2016-04-06 DIAGNOSIS — Z01818 Encounter for other preprocedural examination: Secondary | ICD-10-CM | POA: Insufficient documentation

## 2016-04-06 DIAGNOSIS — I129 Hypertensive chronic kidney disease with stage 1 through stage 4 chronic kidney disease, or unspecified chronic kidney disease: Secondary | ICD-10-CM | POA: Insufficient documentation

## 2016-04-06 DIAGNOSIS — Z79899 Other long term (current) drug therapy: Secondary | ICD-10-CM | POA: Diagnosis not present

## 2016-04-06 DIAGNOSIS — I429 Cardiomyopathy, unspecified: Secondary | ICD-10-CM | POA: Insufficient documentation

## 2016-04-06 DIAGNOSIS — E1122 Type 2 diabetes mellitus with diabetic chronic kidney disease: Secondary | ICD-10-CM | POA: Insufficient documentation

## 2016-04-06 DIAGNOSIS — G4733 Obstructive sleep apnea (adult) (pediatric): Secondary | ICD-10-CM | POA: Diagnosis not present

## 2016-04-06 DIAGNOSIS — Z7901 Long term (current) use of anticoagulants: Secondary | ICD-10-CM | POA: Diagnosis not present

## 2016-04-06 DIAGNOSIS — I4891 Unspecified atrial fibrillation: Secondary | ICD-10-CM | POA: Diagnosis not present

## 2016-04-06 DIAGNOSIS — N189 Chronic kidney disease, unspecified: Secondary | ICD-10-CM | POA: Insufficient documentation

## 2016-04-06 DIAGNOSIS — Z86718 Personal history of other venous thrombosis and embolism: Secondary | ICD-10-CM | POA: Insufficient documentation

## 2016-04-06 DIAGNOSIS — Z87891 Personal history of nicotine dependence: Secondary | ICD-10-CM | POA: Insufficient documentation

## 2016-04-06 DIAGNOSIS — Z01812 Encounter for preprocedural laboratory examination: Secondary | ICD-10-CM | POA: Diagnosis not present

## 2016-04-06 HISTORY — DX: Calculus of kidney: N20.0

## 2016-04-06 LAB — COMPREHENSIVE METABOLIC PANEL
ALT: 17 U/L (ref 17–63)
AST: 15 U/L (ref 15–41)
Albumin: 3.3 g/dL — ABNORMAL LOW (ref 3.5–5.0)
Alkaline Phosphatase: 52 U/L (ref 38–126)
Anion gap: 5 (ref 5–15)
BUN: 25 mg/dL — ABNORMAL HIGH (ref 6–20)
CO2: 24 mmol/L (ref 22–32)
Calcium: 9.6 mg/dL (ref 8.9–10.3)
Chloride: 111 mmol/L (ref 101–111)
Creatinine, Ser: 1.63 mg/dL — ABNORMAL HIGH (ref 0.61–1.24)
GFR calc Af Amer: 46 mL/min — ABNORMAL LOW (ref >60)
GFR calc non Af Amer: 40 mL/min — ABNORMAL LOW (ref >60)
Glucose, Bld: 81 mg/dL (ref 65–99)
Potassium: 4.2 mmol/L (ref 3.5–5.1)
Sodium: 140 mmol/L (ref 135–145)
Total Bilirubin: 0.5 mg/dL (ref 0.3–1.2)
Total Protein: 6.7 g/dL (ref 6.5–8.1)

## 2016-04-06 LAB — SURGICAL PCR SCREEN
MRSA, PCR: NEGATIVE
Staphylococcus aureus: NEGATIVE

## 2016-04-06 LAB — CBC
HCT: 45 % (ref 39.0–52.0)
Hemoglobin: 14.3 g/dL (ref 13.0–17.0)
MCH: 28.4 pg (ref 26.0–34.0)
MCHC: 31.8 g/dL (ref 30.0–36.0)
MCV: 89.3 fL (ref 78.0–100.0)
Platelets: 192 10*3/uL (ref 150–400)
RBC: 5.04 MIL/uL (ref 4.22–5.81)
RDW: 13.9 % (ref 11.5–15.5)
WBC: 13 10*3/uL — ABNORMAL HIGH (ref 4.0–10.5)

## 2016-04-06 LAB — GLUCOSE, CAPILLARY: Glucose-Capillary: 80 mg/dL (ref 65–99)

## 2016-04-06 NOTE — Progress Notes (Signed)
Pt has hx of A-fib and sees Dr. Donnie Aho. States he had an Echo done in May, 2017. Will request copy of Echo from Dr. York Spaniel office. Pt denies chest pain or sob. Called Jessica at Dr. Fredrich Birks office to see if they had requested a cardiac clearance, she states they have not, but will send the paperwork today. Pt has been diagnosed with sleep apnea, but he states he doesn't believe it's accurate and does not use Cpap.

## 2016-04-06 NOTE — Pre-Procedure Instructions (Signed)
Devin Rios  04/06/2016     Your procedure is scheduled on Tuesday, April 10, 2016 at 2:00 PM.   Report to [facility] Entrance "A" Admitting Office at 11:00 AM.   Call this number if you have problems the morning of surgery: [phone]   Any questions prior to day of surgery, please call [phone] between 8 & 4 PM.   Remember:  Do not eat food or drink liquids after midnight Monday, 04/09/16.  Take these medicines the morning of surgery with A SIP OF WATER: Amlodipine (Norvasc), Carvedilol (Coreg), Hydrocodone - if needed  Stop Eliquis as instructed by cardiologist/surgeon.    How to Manage Your Diabetes Before Surgery   Why is it important to control my blood sugar before and after surgery?   Improving blood sugar levels before and after surgery helps healing and can limit problems.  A way of improving blood sugar control is eating a healthy diet by:  - Eating less sugar and carbohydrates  - Increasing activity/exercise  - Talk with your doctor about reaching your blood sugar goals  High blood sugars (greater than 180 mg/dL) can raise your risk of infections and slow down your recovery so you will need to focus on controlling your diabetes during the weeks before surgery.  Make sure that the doctor who takes care of your diabetes knows about your planned surgery including the date and location.  How do I manage my blood sugars before surgery?   Check your blood sugar at least 4 times a day, 2 days before surgery to make sure that they are not too high or low.  Check your blood sugar the morning of your surgery when you wake up and every 2 hours until you get to the Short-Stay unit.  Treat a low blood sugar (less than 70 mg/dL) with 1/2 cup of clear juice (cranberry or apple), 4 glucose tablets, OR glucose gel.  Recheck blood sugar in 15 minutes after treatment (to make sure it is greater than 70 mg/dL).  If blood sugar is not greater than 70 mg/dL on  re-check, call [phone] for further instructions.   Report your blood sugar to the Short-Stay nurse when you get to Short-Stay.  References:  [facility], 2007 "How to Manage your Diabetes Before and After Surgery".  What do I do about my diabetes medications?   Do not take oral diabetes medicines (pills) the morning of surgery.   Do not wear jewelry.  Do not wear lotions, powders, or cologne.  You may wear deoderant.  Men may shave face and neck.  Do not bring valuables to the hospital.  [name] is not responsible for any belongings or valuables.  Contacts, dentures or bridgework may not be worn into surgery.  Leave your suitcase in the car.  After surgery it may be brought to your room.  For patients admitted to the hospital, discharge time will be determined by your treatment team.  Patients discharged the day of surgery will not be allowed to drive home.   Special instructions:  [name] - Preparing for Surgery  Before surgery, you can play an important role.  Because skin is not sterile, your skin needs to be as free of germs as possible.  You can reduce the number of germs on you skin by washing with CHG (chlorahexidine gluconate) soap before surgery.  CHG is an antiseptic cleaner which kills germs and bonds with the skin to continue killing  germs even after washing.  Please DO NOT use if you have an allergy to CHG or antibacterial soaps.  If your skin becomes reddened/irritated stop using the CHG and inform your nurse when you arrive at Short Stay.  Do not shave (including legs and underarms) for at least 48 hours prior to the first CHG shower.  You may shave your face.  Please follow these instructions carefully:   1.  Shower with CHG Soap the night before surgery and the                                morning of Surgery.  2.  If you choose to wash your hair, wash your hair first as usual with your       normal shampoo.  3.  After you  shampoo, rinse your hair and body thoroughly to remove the                      Shampoo.  4.  Use CHG as you would any other liquid soap.  You can apply chg directly       to the skin and wash gently with scrungie or a clean washcloth.  5.  Apply the CHG Soap to your body ONLY FROM THE NECK DOWN.        Do not use on open wounds or open sores.  Avoid contact with your eyes, ears, mouth and genitals (private parts).  Wash genitals (private parts) with your normal soap.  6.  Wash thoroughly, paying special attention to the area where your surgery        will be performed.  7.  Thoroughly rinse your body with warm water from the neck down.  8.  DO NOT shower/wash with your normal soap after using and rinsing off       the CHG Soap.  9.  Pat yourself dry with a clean towel.            10.  Wear clean pajamas.            11.  Place clean sheets on your bed the night of your first shower and do not        sleep with pets.  Day of Surgery  Do not apply any lotions the morning of surgery.  Please wear clean clothes to the hospital.  Please read over the following fact sheets that you were given. Pain Booklet, Coughing and Deep Breathing, MRSA Information and Surgical Site Infection Prevention

## 2016-04-06 NOTE — Progress Notes (Signed)
Pt notified of positive MRSA and need to start Rx. Pt voiced understanding.

## 2016-04-07 LAB — HEMOGLOBIN A1C
Hgb A1c MFr Bld: 6.2 % — ABNORMAL HIGH (ref 4.8–5.6)
Mean Plasma Glucose: 131 mg/dL

## 2016-04-09 NOTE — Progress Notes (Signed)
Anesthesia Chart Review:  Pt is a 75 year old male scheduled for L3-4 redo microdiskectomy on 04/10/2016 with Maeola Harman, MD.   Cardiologist is Viann Fish, MD. PCP is Eulis Manly, MD (care everywhere).   PMH includes:  Atrial fibrillation, non-obstructive cardiomyopathy, HTN, DVT, CKD, OSA, DM. Former smoker. BMI 42. S/p lumbar microdiscectomy 05/28/14.   Medications include: amlodipine, eliquis, benazepril, carvedilol, lasix, glipizide, spironolactone.   Preoperative labs reviewed.   - HgbA1c 6.2, glucose 81 - Cr 1.63, BUN 25. Prior Cr results range 1.48-1.76 over last 2 years (see care everywhere).  EKG 04/06/16: Atrial fibrillation (87 bpm). Nonspecific T wave abnormality  Echo 09/19/15 ([facility]):  1. Moderate concentric LVH with mild to moderate global hypokinesis. EF 40-45% 2. Moderate LA enlargement 3. Mild RA enlargement 4. Mild MR and TR 5. Trace PR  Nuclear stress test on 04/12/11 (correspondence dated 05/30/14 in media tab): - Abnormal Lexiscan cardiolite with evidence of cardiomyopathy.  - Fixed inferior defect consistent with attenuation or infarction.  - No evidence of ischemia.  - Abnormal quantitative gated SPECT EF of 45% with mild global hypokinesis.   If no changes, I anticipate pt can proceed with surgery as scheduled.   Rica Mast, FNP-BC [facility] Stay Surgical Center/Anesthesiology Phone: [phone] 04/09/2016 2:27 PM

## 2016-04-12 MED ORDER — DEXTROSE 5 % IV SOLN
3.0000 g | INTRAVENOUS | Status: AC
  Administered 2016-04-13: 3 g via INTRAVENOUS
  Filled 2016-04-12 (×2): qty 3000

## 2016-04-12 NOTE — Progress Notes (Signed)
I spoke with Devin Rios .  Patient stated that he is to arrive at 9:00 AM for surgery Friday, July 14.

## 2016-04-13 ENCOUNTER — Encounter (HOSPITAL_COMMUNITY): Payer: Self-pay | Admitting: *Deleted

## 2016-04-13 ENCOUNTER — Ambulatory Visit (HOSPITAL_COMMUNITY): Payer: Medicare Other | Admitting: Anesthesiology

## 2016-04-13 ENCOUNTER — Encounter (HOSPITAL_COMMUNITY)
Admission: AD | Disposition: A | Discharge status: Home / Self Care | Payer: Self-pay | Source: Ambulatory Visit | Attending: Neurosurgery

## 2016-04-13 ENCOUNTER — Inpatient Hospital Stay (HOSPITAL_COMMUNITY)
Admission: AD | Admit: 2016-04-13 | Discharge: 2016-04-14 | DRG: 519 | Disposition: A | Discharge status: Home / Self Care | Payer: Medicare Other | Source: Ambulatory Visit | Attending: Neurosurgery | Admitting: Neurosurgery

## 2016-04-13 ENCOUNTER — Ambulatory Visit (HOSPITAL_COMMUNITY): Payer: Medicare Other

## 2016-04-13 ENCOUNTER — Ambulatory Visit (HOSPITAL_COMMUNITY): Payer: Medicare Other | Admitting: Emergency Medicine

## 2016-04-13 DIAGNOSIS — I10 Essential (primary) hypertension: Secondary | ICD-10-CM | POA: Diagnosis not present

## 2016-04-13 DIAGNOSIS — M5116 Intervertebral disc disorders with radiculopathy, lumbar region: Secondary | ICD-10-CM | POA: Diagnosis not present

## 2016-04-13 DIAGNOSIS — M5440 Lumbago with sciatica, unspecified side: Secondary | ICD-10-CM | POA: Diagnosis present

## 2016-04-13 DIAGNOSIS — Z79899 Other long term (current) drug therapy: Secondary | ICD-10-CM

## 2016-04-13 DIAGNOSIS — Z419 Encounter for procedure for purposes other than remedying health state, unspecified: Secondary | ICD-10-CM

## 2016-04-13 DIAGNOSIS — Z6841 Body Mass Index (BMI) 40.0 and over, adult: Secondary | ICD-10-CM | POA: Diagnosis not present

## 2016-04-13 DIAGNOSIS — Z7901 Long term (current) use of anticoagulants: Secondary | ICD-10-CM | POA: Diagnosis not present

## 2016-04-13 DIAGNOSIS — M4726 Other spondylosis with radiculopathy, lumbar region: Secondary | ICD-10-CM | POA: Diagnosis present

## 2016-04-13 DIAGNOSIS — Z720 Tobacco use: Secondary | ICD-10-CM

## 2016-04-13 DIAGNOSIS — I4891 Unspecified atrial fibrillation: Secondary | ICD-10-CM | POA: Diagnosis not present

## 2016-04-13 DIAGNOSIS — M519 Unspecified thoracic, thoracolumbar and lumbosacral intervertebral disc disorder: Secondary | ICD-10-CM | POA: Diagnosis present

## 2016-04-13 HISTORY — PX: LUMBAR LAMINECTOMY/DECOMPRESSION MICRODISCECTOMY: SHX5026

## 2016-04-13 LAB — GLUCOSE, CAPILLARY
Glucose-Capillary: 162 mg/dL — ABNORMAL HIGH (ref 65–99)
Glucose-Capillary: 138 mg/dL — ABNORMAL HIGH (ref 65–99)
Glucose-Capillary: 186 mg/dL — ABNORMAL HIGH (ref 65–99)

## 2016-04-13 LAB — PROTIME-INR
INR: 1.08 (ref 0.00–1.49)
Prothrombin Time: 14.2 seconds (ref 11.6–15.2)

## 2016-04-13 SURGERY — LUMBAR LAMINECTOMY/DECOMPRESSION MICRODISCECTOMY 1 LEVEL
Anesthesia: General | Laterality: Left

## 2016-04-13 MED ORDER — PROPOFOL 10 MG/ML IV BOLUS
INTRAVENOUS | Status: AC
  Filled 2016-04-13: qty 40

## 2016-04-13 MED ORDER — HYDROCODONE-ACETAMINOPHEN 5-325 MG PO TABS
1.0000 | ORAL_TABLET | ORAL | Status: DC | PRN
  Administered 2016-04-13 – 2016-04-14 (×2): 2 via ORAL
  Filled 2016-04-13 (×2): qty 2

## 2016-04-13 MED ORDER — HYDROMORPHONE HCL 1 MG/ML IJ SOLN
INTRAMUSCULAR | Status: AC
  Filled 2016-04-13: qty 1

## 2016-04-13 MED ORDER — PHENOL 1.4 % MT LIQD
1.0000 | OROMUCOSAL | Status: DC | PRN
Start: 2016-04-13 — End: 2016-04-14

## 2016-04-13 MED ORDER — SPIRONOLACTONE 25 MG PO TABS
25.0000 mg | ORAL_TABLET | Freq: Every day | ORAL | Status: DC
  Administered 2016-04-13: 25 mg via ORAL
  Filled 2016-04-13 (×2): qty 1

## 2016-04-13 MED ORDER — DOCUSATE SODIUM 100 MG PO CAPS: 100.0000 mg | ORAL_CAPSULE | Freq: Every day | ORAL | Status: DC | PRN

## 2016-04-13 MED ORDER — DM-GUAIFENESIN ER 30-600 MG PO TB12
1.0000 | ORAL_TABLET | Freq: Every day | ORAL | Status: DC | PRN
  Filled 2016-04-13: qty 1

## 2016-04-13 MED ORDER — SUCCINYLCHOLINE CHLORIDE 20 MG/ML IJ SOLN
INTRAMUSCULAR | Status: DC | PRN
  Administered 2016-04-13: 100 mg via INTRAVENOUS

## 2016-04-13 MED ORDER — THROMBIN 5000 UNITS EX SOLR
CUTANEOUS | Status: DC | PRN
  Administered 2016-04-13 (×2): 5000 [IU] via TOPICAL

## 2016-04-13 MED ORDER — CHLORHEXIDINE GLUCONATE CLOTH 2 % EX PADS: 6.0000 | MEDICATED_PAD | Freq: Once | CUTANEOUS | Status: DC

## 2016-04-13 MED ORDER — ALBUMIN HUMAN 5 % IV SOLN
INTRAVENOUS | Status: DC | PRN
  Administered 2016-04-13: 14:00:00 via INTRAVENOUS

## 2016-04-13 MED ORDER — METHYLPREDNISOLONE ACETATE 80 MG/ML IJ SUSP
INTRAMUSCULAR | Status: DC | PRN
  Administered 2016-04-13: 80 mg via INTRA_ARTICULAR

## 2016-04-13 MED ORDER — ACETAMINOPHEN 650 MG RE SUPP: 650.0000 mg | RECTAL | Status: DC | PRN

## 2016-04-13 MED ORDER — ALBUTEROL SULFATE HFA 108 (90 BASE) MCG/ACT IN AERS
INHALATION_SPRAY | RESPIRATORY_TRACT | Status: DC | PRN
  Administered 2016-04-13 (×2): 2 via RESPIRATORY_TRACT

## 2016-04-13 MED ORDER — METHOCARBAMOL 500 MG PO TABS
500.0000 mg | ORAL_TABLET | Freq: Four times a day (QID) | ORAL | Status: DC | PRN
  Administered 2016-04-13: 500 mg via ORAL
  Filled 2016-04-13: qty 1

## 2016-04-13 MED ORDER — ONDANSETRON HCL 4 MG/2ML IJ SOLN: 4.0000 mg | Freq: Once | INTRAMUSCULAR | Status: DC | PRN

## 2016-04-13 MED ORDER — 0.9 % SODIUM CHLORIDE (POUR BTL) OPTIME
TOPICAL | Status: DC | PRN
  Administered 2016-04-13: 1000 mL

## 2016-04-13 MED ORDER — ONDANSETRON HCL 4 MG/2ML IJ SOLN: 4.0000 mg | INTRAMUSCULAR | Status: DC | PRN

## 2016-04-13 MED ORDER — GLIPIZIDE ER 10 MG PO TB24
10.0000 mg | ORAL_TABLET | Freq: Every day | ORAL | Status: DC
  Filled 2016-04-13: qty 1

## 2016-04-13 MED ORDER — BENAZEPRIL HCL 20 MG PO TABS
20.0000 mg | ORAL_TABLET | Freq: Every day | ORAL | Status: DC
  Administered 2016-04-13: 20 mg via ORAL
  Filled 2016-04-13 (×2): qty 1

## 2016-04-13 MED ORDER — CARVEDILOL 6.25 MG PO TABS: 25.0000 mg | ORAL_TABLET | Freq: Every day | ORAL | Status: DC

## 2016-04-13 MED ORDER — DOCUSATE SODIUM 100 MG PO CAPS
100.0000 mg | ORAL_CAPSULE | Freq: Two times a day (BID) | ORAL | Status: DC
  Administered 2016-04-13: 100 mg via ORAL
  Filled 2016-04-13: qty 1

## 2016-04-13 MED ORDER — FENTANYL CITRATE (PF) 250 MCG/5ML IJ SOLN
INTRAMUSCULAR | Status: AC
  Filled 2016-04-13: qty 5

## 2016-04-13 MED ORDER — AMLODIPINE BESYLATE 5 MG PO TABS
5.0000 mg | ORAL_TABLET | Freq: Every day | ORAL | Status: DC
  Filled 2016-04-13: qty 1

## 2016-04-13 MED ORDER — SENNOSIDES-DOCUSATE SODIUM 8.6-50 MG PO TABS: 1.0000 | ORAL_TABLET | Freq: Every evening | ORAL | Status: DC | PRN

## 2016-04-13 MED ORDER — ACETAMINOPHEN 325 MG PO TABS: 650.0000 mg | ORAL_TABLET | ORAL | Status: DC | PRN

## 2016-04-13 MED ORDER — FENTANYL CITRATE (PF) 250 MCG/5ML IJ SOLN
INTRAMUSCULAR | Status: DC | PRN
  Administered 2016-04-13 (×5): 50 ug via INTRAVENOUS

## 2016-04-13 MED ORDER — DEXTROMETHORPHAN-GUAIFENESIN 10-200 MG PO CAPS: 1.0000 | ORAL_CAPSULE | Freq: Every day | ORAL | Status: DC | PRN

## 2016-04-13 MED ORDER — VITAMIN D3 25 MCG (1000 UNIT) PO TABS
5000.0000 [IU] | ORAL_TABLET | ORAL | Status: DC
  Administered 2016-04-13: 5000 [IU] via ORAL
  Filled 2016-04-13 (×2): qty 5

## 2016-04-13 MED ORDER — CEFAZOLIN SODIUM-DEXTROSE 2-4 GM/100ML-% IV SOLN
2.0000 g | Freq: Three times a day (TID) | INTRAVENOUS | Status: AC
  Administered 2016-04-13: 2 g via INTRAVENOUS
  Filled 2016-04-13: qty 100

## 2016-04-13 MED ORDER — LIDOCAINE-EPINEPHRINE 1 %-1:100000 IJ SOLN
INTRAMUSCULAR | Status: DC | PRN
  Administered 2016-04-13: 5 mL

## 2016-04-13 MED ORDER — KCL IN DEXTROSE-NACL 20-5-0.45 MEQ/L-%-% IV SOLN: INTRAVENOUS | Status: DC

## 2016-04-13 MED ORDER — SODIUM CHLORIDE 0.9 % IV SOLN: 250.0000 mL | INTRAVENOUS | Status: DC

## 2016-04-13 MED ORDER — LACTATED RINGERS IV SOLN
Freq: Once | INTRAVENOUS | Status: AC
  Administered 2016-04-13: 10:00:00 via INTRAVENOUS

## 2016-04-13 MED ORDER — METHOCARBAMOL 1000 MG/10ML IJ SOLN
500.0000 mg | Freq: Four times a day (QID) | INTRAVENOUS | Status: DC | PRN
  Filled 2016-04-13: qty 5

## 2016-04-13 MED ORDER — PHENYLEPHRINE HCL 10 MG/ML IJ SOLN
INTRAMUSCULAR | Status: DC | PRN
  Administered 2016-04-13 (×2): 80 ug via INTRAVENOUS

## 2016-04-13 MED ORDER — HEMOSTATIC AGENTS (NO CHARGE) OPTIME
TOPICAL | Status: DC | PRN
  Administered 2016-04-13: 1 via TOPICAL

## 2016-04-13 MED ORDER — FUROSEMIDE 40 MG PO TABS
40.0000 mg | ORAL_TABLET | ORAL | Status: DC
  Filled 2016-04-13: qty 1

## 2016-04-13 MED ORDER — FENTANYL CITRATE (PF) 100 MCG/2ML IJ SOLN
INTRAMUSCULAR | Status: DC | PRN
  Administered 2016-04-13: 100 ug via INTRAVENOUS

## 2016-04-13 MED ORDER — OXYCODONE-ACETAMINOPHEN 5-325 MG PO TABS
1.0000 | ORAL_TABLET | ORAL | Status: DC | PRN
  Administered 2016-04-13: 2 via ORAL
  Filled 2016-04-13: qty 2

## 2016-04-13 MED ORDER — BISACODYL 10 MG RE SUPP: 10.0000 mg | Freq: Every day | RECTAL | Status: DC | PRN

## 2016-04-13 MED ORDER — HYDROMORPHONE HCL 1 MG/ML IJ SOLN: 0.5000 mg | INTRAMUSCULAR | Status: DC | PRN

## 2016-04-13 MED ORDER — PHENYLEPHRINE HCL 10 MG/ML IJ SOLN: INTRAMUSCULAR | Status: DC | PRN

## 2016-04-13 MED ORDER — ALUM & MAG HYDROXIDE-SIMETH 200-200-20 MG/5ML PO SUSP: 30.0000 mL | Freq: Four times a day (QID) | ORAL | Status: DC | PRN

## 2016-04-13 MED ORDER — ONDANSETRON HCL 4 MG/2ML IJ SOLN
INTRAMUSCULAR | Status: AC
  Filled 2016-04-13: qty 2

## 2016-04-13 MED ORDER — ZOLPIDEM TARTRATE 5 MG PO TABS
5.0000 mg | ORAL_TABLET | Freq: Every evening | ORAL | Status: DC | PRN
Start: 2016-04-13 — End: 2016-04-14

## 2016-04-13 MED ORDER — LACTATED RINGERS IV SOLN
INTRAVENOUS | Status: DC | PRN
  Administered 2016-04-13: 13:00:00 via INTRAVENOUS

## 2016-04-13 MED ORDER — FENTANYL CITRATE (PF) 100 MCG/2ML IJ SOLN
INTRAMUSCULAR | Status: AC
  Filled 2016-04-13: qty 2

## 2016-04-13 MED ORDER — CALCIUM CHLORIDE 10 % IV SOLN
INTRAVENOUS | Status: DC | PRN
  Administered 2016-04-13 (×4): 100 mg via INTRAVENOUS

## 2016-04-13 MED ORDER — MENTHOL 3 MG MT LOZG: 1.0000 | LOZENGE | OROMUCOSAL | Status: DC | PRN

## 2016-04-13 MED ORDER — LIDOCAINE HCL (CARDIAC) 20 MG/ML IV SOLN
INTRAVENOUS | Status: DC | PRN
  Administered 2016-04-13: 100 mg via INTRAVENOUS

## 2016-04-13 MED ORDER — PROPOFOL 10 MG/ML IV BOLUS
INTRAVENOUS | Status: DC | PRN
  Administered 2016-04-13: 200 mg via INTRAVENOUS

## 2016-04-13 MED ORDER — EPHEDRINE SULFATE 50 MG/ML IJ SOLN
INTRAMUSCULAR | Status: DC | PRN
  Administered 2016-04-13: 15 mg via INTRAVENOUS
  Administered 2016-04-13: 10 mg via INTRAVENOUS

## 2016-04-13 MED ORDER — ROCURONIUM BROMIDE 100 MG/10ML IV SOLN
INTRAVENOUS | Status: DC | PRN
  Administered 2016-04-13: 50 mg via INTRAVENOUS

## 2016-04-13 MED ORDER — BACLOFEN 10 MG PO TABS
10.0000 mg | ORAL_TABLET | Freq: Two times a day (BID) | ORAL | Status: DC | PRN
  Filled 2016-04-13: qty 1

## 2016-04-13 MED ORDER — BUPIVACAINE HCL (PF) 0.5 % IJ SOLN
INTRAMUSCULAR | Status: DC | PRN
  Administered 2016-04-13: 5 mL

## 2016-04-13 MED ORDER — ONDANSETRON HCL 4 MG/2ML IJ SOLN
INTRAMUSCULAR | Status: DC | PRN
  Administered 2016-04-13: 4 mg via INTRAVENOUS

## 2016-04-13 MED ORDER — SODIUM CHLORIDE 0.9% FLUSH: 3.0000 mL | Freq: Two times a day (BID) | INTRAVENOUS | Status: DC

## 2016-04-13 MED ORDER — SUGAMMADEX SODIUM 500 MG/5ML IV SOLN
INTRAVENOUS | Status: DC | PRN
  Administered 2016-04-13: 275 mg via INTRAVENOUS

## 2016-04-13 MED ORDER — FLEET ENEMA 7-19 GM/118ML RE ENEM: 1.0000 | ENEMA | Freq: Once | RECTAL | Status: DC | PRN

## 2016-04-13 MED ORDER — FAMOTIDINE IN NACL 20-0.9 MG/50ML-% IV SOLN
20.0000 mg | Freq: Two times a day (BID) | INTRAVENOUS | Status: DC
  Administered 2016-04-13: 20 mg via INTRAVENOUS
  Filled 2016-04-13 (×4): qty 50

## 2016-04-13 MED ORDER — HYDROCODONE-ACETAMINOPHEN 10-325 MG PO TABS: 1.0000 | ORAL_TABLET | ORAL | Status: DC | PRN

## 2016-04-13 MED ORDER — HYDROMORPHONE HCL 1 MG/ML IJ SOLN
0.5000 mg | INTRAMUSCULAR | Status: DC | PRN
  Administered 2016-04-13 (×2): 0.5 mg via INTRAVENOUS

## 2016-04-13 MED ORDER — SODIUM CHLORIDE 0.9 % IV SOLN
10.0000 mg | INTRAVENOUS | Status: DC | PRN
  Administered 2016-04-13: 100 ug/min via INTRAVENOUS

## 2016-04-13 MED ORDER — SODIUM CHLORIDE 0.9% FLUSH: 3.0000 mL | INTRAVENOUS | Status: DC | PRN

## 2016-04-13 SURGICAL SUPPLY — 56 items
BENZOIN TINCTURE PRP APPL 2/3 (GAUZE/BANDAGES/DRESSINGS) IMPLANT
BIT DRILL NEURO 2X3.1 SFT TUCH (MISCELLANEOUS) ×1 IMPLANT
BLADE CLIPPER SURG (BLADE) IMPLANT
BUR ROUND FLUTED 5 RND (BURR) ×2 IMPLANT
CANISTER SUCT 3000ML PPV (MISCELLANEOUS) ×2 IMPLANT
DECANTER SPIKE VIAL GLASS SM (MISCELLANEOUS) ×2 IMPLANT
DERMABOND ADVANCED (GAUZE/BANDAGES/DRESSINGS) ×1
DERMABOND ADVANCED .7 DNX12 (GAUZE/BANDAGES/DRESSINGS) ×1 IMPLANT
DRAPE LAPAROTOMY 100X72X124 (DRAPES) ×2 IMPLANT
DRAPE MICROSCOPE LEICA (MISCELLANEOUS) ×2 IMPLANT
DRAPE POUCH INSTRU U-SHP 10X18 (DRAPES) ×2 IMPLANT
DRAPE SURG 17X23 STRL (DRAPES) ×2 IMPLANT
DRILL NEURO 2X3.1 SOFT TOUCH (MISCELLANEOUS) ×2
DRSG OPSITE POSTOP 4X6 (GAUZE/BANDAGES/DRESSINGS) ×2 IMPLANT
DURAPREP 26ML APPLICATOR (WOUND CARE) ×2 IMPLANT
ELECT REM PT RETURN 9FT ADLT (ELECTROSURGICAL) ×2
ELECTRODE REM PT RTRN 9FT ADLT (ELECTROSURGICAL) ×1 IMPLANT
GAUZE SPONGE 4X4 12PLY STRL (GAUZE/BANDAGES/DRESSINGS) IMPLANT
GAUZE SPONGE 4X4 16PLY XRAY LF (GAUZE/BANDAGES/DRESSINGS) IMPLANT
GLOVE BIO SURGEON STRL SZ8 (GLOVE) ×2 IMPLANT
GLOVE BIOGEL PI IND STRL 8 (GLOVE) ×2 IMPLANT
GLOVE BIOGEL PI IND STRL 8.5 (GLOVE) ×2 IMPLANT
GLOVE BIOGEL PI INDICATOR 8 (GLOVE) ×2
GLOVE BIOGEL PI INDICATOR 8.5 (GLOVE) ×2
GLOVE ECLIPSE 7.5 STRL STRAW (GLOVE) ×4 IMPLANT
GLOVE ECLIPSE 8.0 STRL XLNG CF (GLOVE) ×4 IMPLANT
GLOVE EXAM NITRILE LRG STRL (GLOVE) IMPLANT
GLOVE EXAM NITRILE MD LF STRL (GLOVE) IMPLANT
GLOVE EXAM NITRILE XL STR (GLOVE) IMPLANT
GLOVE EXAM NITRILE XS STR PU (GLOVE) IMPLANT
GLOVE INDICATOR 7.5 STRL GRN (GLOVE) ×2 IMPLANT
GLOVE SS BIOGEL STRL SZ 7 (GLOVE) ×1 IMPLANT
GLOVE SUPERSENSE BIOGEL SZ 7 (GLOVE) ×1
GOWN STRL REUS W/ TWL LRG LVL3 (GOWN DISPOSABLE) ×3 IMPLANT
GOWN STRL REUS W/ TWL XL LVL3 (GOWN DISPOSABLE) ×2 IMPLANT
GOWN STRL REUS W/TWL 2XL LVL3 (GOWN DISPOSABLE) ×2 IMPLANT
GOWN STRL REUS W/TWL LRG LVL3 (GOWN DISPOSABLE) ×3
GOWN STRL REUS W/TWL XL LVL3 (GOWN DISPOSABLE) ×2
KIT BASIN OR (CUSTOM PROCEDURE TRAY) ×2 IMPLANT
KIT ROOM TURNOVER OR (KITS) ×2 IMPLANT
NEEDLE HYPO 18GX1.5 BLUNT FILL (NEEDLE) IMPLANT
NEEDLE HYPO 25X1 1.5 SAFETY (NEEDLE) ×2 IMPLANT
NS IRRIG 1000ML POUR BTL (IV SOLUTION) ×2 IMPLANT
PACK LAMINECTOMY NEURO (CUSTOM PROCEDURE TRAY) ×2 IMPLANT
PAD ARMBOARD 7.5X6 YLW CONV (MISCELLANEOUS) ×6 IMPLANT
RUBBERBAND STERILE (MISCELLANEOUS) ×4 IMPLANT
SPONGE SURGIFOAM ABS GEL SZ50 (HEMOSTASIS) ×2 IMPLANT
STRIP CLOSURE SKIN 1/2X4 (GAUZE/BANDAGES/DRESSINGS) IMPLANT
SUT VIC AB 0 CT1 18XCR BRD8 (SUTURE) ×1 IMPLANT
SUT VIC AB 0 CT1 8-18 (SUTURE) ×1
SUT VIC AB 2-0 CT1 18 (SUTURE) ×2 IMPLANT
SUT VIC AB 3-0 SH 8-18 (SUTURE) ×2 IMPLANT
SYR 5ML LL (SYRINGE) IMPLANT
TOWEL OR 17X24 6PK STRL BLUE (TOWEL DISPOSABLE) ×2 IMPLANT
TOWEL OR 17X26 10 PK STRL BLUE (TOWEL DISPOSABLE) ×2 IMPLANT
WATER STERILE IRR 1000ML POUR (IV SOLUTION) ×2 IMPLANT

## 2016-04-13 NOTE — Anesthesia Preprocedure Evaluation (Addendum)
Anesthesia Evaluation  Patient identified by MRN, date of birth, ID band Patient awake    Reviewed: Allergy & Precautions, NPO status , Patient's Chart, lab work & pertinent test results  Airway Mallampati: I  TM Distance: >3 FB     Dental  (+) Partial Upper, Partial Lower, Missing, Dental Advisory Given   Pulmonary shortness of breath, sleep apnea , former smoker,     + decreased breath sounds      Cardiovascular hypertension, + Peripheral Vascular Disease and +CHF  + dysrhythmias Atrial Fibrillation  Rhythm:Irregular Rate:Abnormal     Neuro/Psych Depression  Neuromuscular disease    GI/Hepatic   Endo/Other  diabetes, Type 2, Oral Hypoglycemic AgentsMorbid obesity  Renal/GU Renal InsufficiencyRenal disease     Musculoskeletal  (+) Arthritis ,   Abdominal   Peds  Hematology   Anesthesia Other Findings   Reproductive/Obstetrics                            Anesthesia Physical Anesthesia Plan  ASA: III  Anesthesia Plan: General   Post-op Pain Management:    Induction: Intravenous  Airway Management Planned: Oral ETT  Additional Equipment:   Intra-op Plan:   Post-operative Plan: Extubation in OR and Possible Post-op intubation/ventilation  Informed Consent: I have reviewed the patients History and Physical, chart, labs and discussed the procedure including the risks, benefits and alternatives for the proposed anesthesia with the patient or authorized representative who has indicated his/her understanding and acceptance.     Plan Discussed with: CRNA, Anesthesiologist and Surgeon  Anesthesia Plan Comments:         Anesthesia Quick Evaluation

## 2016-04-13 NOTE — Interval H&P Note (Signed)
History and Physical Interval Note:  04/13/2016 1:25 PM  COLONEL BITZER  has presented today for surgery, with the diagnosis of HNP  The various methods of treatment have been discussed with the patient and family. After consideration of risks, benefits and other options for treatment, the patient has consented to  Procedure(s): Redo Microdiscectomy - left - L3-L4 (Left) as a surgical intervention .  The patient's history has been reviewed, patient examined, no change in status, stable for surgery.  I have reviewed the patient's chart and labs.  Questions were answered to the patient's satisfaction.     Britton Perkinson D

## 2016-04-13 NOTE — Transfer of Care (Signed)
Immediate Anesthesia Transfer of Care Note  Patient: Devin Rios  Procedure(s) Performed: Procedure(s): Redo Microdiscectomy - left - Lumbar three--Lumbar four (Left)  Patient Location: PACU  Anesthesia Type:General  Level of Consciousness: awake, alert  and patient cooperative  Airway & Oxygen Therapy: Patient Spontanous Breathing and Patient connected to nasal cannula oxygen  Post-op Assessment: Report given to RN and Post -op Vital signs reviewed and stable  Post vital signs: Reviewed and stable  Last Vitals:  Filed Vitals:   04/13/16 0917 04/13/16 1535  BP: 109/77   Pulse: 72   Temp: 36.6 C 36.3 C  Resp: 20     Last Pain:  Filed Vitals:   04/13/16 1536  PainSc: 2       Patients Stated Pain Goal: 0 (04/13/16 0939)  Complications: No apparent anesthesia complications

## 2016-04-13 NOTE — H&P (Signed)
Patient ID:   [id number] Patient: Devin Rios  Date of Birth: 26-Mar-1941 Visit Type: Office Visit   Date: 04/02/2016 02:00 PM Provider: Danae Orleans. Venetia Maxon MD   This 75 year old male presents for back pain.  History of Present Illness: 1.  back pain    05/28/14 left L3-L4 microdiscectomy  [name] visits for review of his lumbar MRI and evaluation of left lumbar and left lateral thigh pain.  He recalls lifting a box and feeling back pain in late May.  Pain increased prompting primary physician visit early June.  Norco 10/325 3 times a day Rx by Dr. Loistine Chance  MRI on [facility]  The patient's MRI shows a significant recurrent disc herniation at L3 L4 on the left with significant left L4 nerve root compression and lateral recess compression as well.  The patient describes that he is quite miserable.  He has had increased pain since early June and is not getting relief with pain medication including Norco 10/325 3 times daily.  The patient is on Eliquis for atrial fibrillation and knows that he needs to stop that 3 days prior to any kind of surgery.  We will try the patient on a steroid Dosepak to see if this will give her relief of his discomfort.  The patient is significant discomfort into his left leg in an L4 distribution as well as weakness with standing and bearing weight on his left leg.  He does have some quadriceps weakness on the left.  He has a markedly positive seated straight leg raise.  In addition to the structural pathology at L3 L4 the patient has additional multiple levels of his lumbar spine with significant structural abnormalities.        PAST MEDICAL/SURGICAL HISTORY   (Reviewed, updated)  Disease/disorder Onset Date Management Date Comments    Tonsillectomy 1966     Surgery, lumbar spine 1984   Hypertension       DIAGNOSTICS HISTORY: Test Ordered Ordering Comments Modifier  Lumbar Spine- AP/Lat/Flex/Ex 05/10/2014      PAST MEDICAL HISTORY, SURGICAL  HISTORY, FAMILY HISTORY, SOCIAL HISTORY AND REVIEW OF SYSTEMS I have reviewed the patient's past medical, surgical, family and social history as well as the comprehensive review of systems as included on the [facility] history form dated 04/02/2016, which I have signed.  Family History  (Reviewed, updated) Relationship Family Member Name Deceased Age at Death Condition Onset Age Cause of Death      Family history of Hypertension  N      Family history of Diabetes mellitus  N  Mother    Stroke  N    SOCIAL HISTORY  (Reviewed, updated) Tobacco use reviewed. Preferred language is Unknown.   Smoking status: Never smoker.  SMOKING STATUS Use Status Type Smoking Status Usage Per Day Years Used Total Pack Years  no/never  Never smoker       HOME ENVIRONMENT/SAFETY The patient has not fallen in the last year.        MEDICATIONS(added, continued or stopped this visit): Started Medication Directions Instruction Stopped   Aldactone 25 mg tablet take 1 tablet by oral route  every day     amlodipine 5 mg tablet take 1 tablet by oral route  every day     carvedilol 25 mg tablet take 1 tablet by oral route 2 times every day with food     Eliquis 2.5 mg tablet take 1 tablet by oral route 2 times every day  gabapentin 300 mg capsule take 1 capsule by oral route 3 times every day     Glucotrol XL 10 mg tablet,extended release take 1 tablet by oral route  every day with breakfast     Lasix 40 mg tablet take 1 tablet by oral route  every day     Lotensin 20 mg tablet take 1 tablet by oral route  every day    03/16/2015 Norco 10 mg-325 mg tablet Take 1/2 -1 tab q6hrs prn pain    04/02/2016 prednisone 10 mg tablets in a dose pack Take as instructed     tizanidine 4 mg capsule take 1 capsule by oral route 3 times every day    12/29/2014 tramadol 50 mg tablet take 1 tablet by oral route  every 6 hours as needed       ALLERGIES: Ingredient Reaction Medication  Name Comment  CODEINE Stomach Pain        Vitals Date Temp F BP Pulse Ht In Wt Lb BMI BSA Pain Score  04/02/2016  121/86 88 71 298.8 41.67  9/10      IMPRESSION Given the severity of the patient's weakness and pain I recommended that he undergo redo L3 L4 microdiscectomy on the left.  We discussed the pros and cons of surgical fusion and I indicated the patient that because of his large body habitus surgery and recovery can be difficult but I would like to see if he would get relief without undergoing fusion surgery.  If you brief ruptures the disc again and that would be necessary.  Completed Orders (this encounter) Order Details Reason Side Interpretation Result Initial Treatment Date Region  Lifestyle education regarding diet Patient taken medication as prescribed         Assessment/Plan # Detail Type Description   1. Assessment Low back pain, unspecified back pain laterality, with sciatica presence unspecified (M54.5).       2. Assessment Radiculopathy, lumbar region (M54.16).       3. Assessment Body mass index (BMI) 40.0-44.9, adult (Z68.41).   Plan Orders Today's instructions / counseling include(s) Lifestyle education regarding diet.       4. Assessment Herniated nucleus pulposus, lumbar (M51.26).         Pain Assessment/Treatment Pain Scale: 9/10. Method: Numeric Pain Intensity Scale. Location: back. Onset: 04/19/2014. Duration: varies. Quality: discomforting. Pain Assessment/Treatment follow-up plan of care: Patient taken medication as prescribed.  Fall Risk Plan The patient has not fallen in the last year.  Plan is redo left L3 L4 microdiscectomy.  Risks and benefits were discussed in detail with the patient.  Nurse education was performed as well.  Surgery is planned for 04/10/16.  Orders: Diagnostic Procedures: Assessment Procedure  M51.26 RedoMicrodiscectomy - left - L3-L4  M51.26 Return to Clinic  Instruction(s)/Education: Assessment Instruction   Z68.41 Lifestyle education regarding diet    MEDICATIONS PRESCRIBED TODAY    Rx Quantity Refills  PREDNISONE 10 mg  1 0            Provider:  Danae Orleans. Venetia Maxon MD  04/07/2016 04:04 PM Dictation edited by: Danae Orleans. Venetia Maxon    CC Providers: [facility] [address] [place], [place] [address]-              Electronically signed by Danae Orleans. Venetia Maxon MD on 04/07/2016 04:04 PM

## 2016-04-13 NOTE — Evaluation (Signed)
Physical Therapy Evaluation Patient Details Name: Devin Rios MRN: [id number] DOB: 09-18-1941 Today's Date: 04/13/2016   History of Present Illness  Patient is a 75 yo male admitted 04/13/16 with back pain.  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  Patient now s/p Redo Microdiscectomy - left - Lumbar three--Lumbar four (Left) with microdissection.   PMH: morbid obesity, HTN, CHF, PVD, Afib, OSA, DM, renal insufficiency, arthritis, back pain, back surgery x2.    Clinical Impression  Patient able to ambulate 70' with rollator and supervision.  Functioning at Mod I for all other mobility.  Provided back education to patient and wife.  Patient and wife with no further questions.  Patient ready for d/c from PT perspective.  Encouraged patient to ambulate with nursing in hallway periodically until d/c.    Follow Up Recommendations No PT follow up;Supervision for mobility/OOB    Equipment Recommendations  None recommended by PT    Recommendations for Other Services       Precautions / Restrictions Precautions Precautions: Back Precaution Booklet Issued: Yes (comment) Precaution Comments: Reviewed back precautions/care instructions with patient and wife. Restrictions Weight Bearing Restrictions: No      Mobility  Bed Mobility Overal bed mobility: Modified Independent             General bed mobility comments: Reviewed proper technique.  Required increased time.  Transfers Overall transfer level: Modified independent Equipment used: 4-wheeled walker             General transfer comment: Patient using proper technique.  Ambulation/Gait Ambulation/Gait assistance: Supervision Ambulation Distance (Feet): 140 Feet Assistive device: 4-wheeled walker Gait Pattern/deviations: Step-through pattern;Decreased step length - right;Decreased step length - left;Decreased stride length Gait velocity: decreased Gait velocity  interpretation: Below normal speed for age/gender General Gait Details: Verbal cues to relax shoulders and stand upright during gait.  Safe use of rollator noted.  Stairs            Wheelchair Mobility    Modified Rankin (Stroke Patients Only)       Balance                                             Pertinent Vitals/Pain Pain Assessment: 0-10 Pain Score: 1  Pain Location: Incisional pain Pain Descriptors / Indicators: Sore Pain Intervention(s): Monitored during session    Home Living Family/patient expects to be discharged to:: Private residence Living Arrangements: Spouse/significant other Available Help at Discharge: Family;Available 24 hours/day Type of Home: House Home Access: Stairs to enter Entrance Stairs-Rails: Doctor, general practice of Steps: 3 Home Layout: Two level;Able to live on main level with bedroom/bathroom (Has stair chair lift to go upstairs if needed.) Home Equipment: Walker - 4 wheels;Cane - single point;Shower seat;Bedside commode      Prior Function Level of Independence: Independent with assistive device(s)         Comments: Using rollator for ambulation     Hand Dominance        Extremity/Trunk Assessment   Upper Extremity Assessment: Overall WFL for tasks assessed           Lower Extremity Assessment: Generalized weakness         Communication   Communication: No difficulties  Cognition Arousal/Alertness: Awake/alert Behavior During Therapy: WFL for tasks assessed/performed Overall Cognitive Status: Within Functional Limits for tasks assessed  General Comments      Exercises        Assessment/Plan    PT Assessment Patent does not need any further PT services  PT Diagnosis Abnormality of gait;Acute pain;Generalized weakness   PT Problem List    PT Treatment Interventions     PT Goals (Current goals can be found in the Care Plan section) Acute  Rehab PT Goals PT Goal Formulation: All assessment and education complete, DC therapy    Frequency     Barriers to discharge        Co-evaluation               End of Session   Activity Tolerance: Patient tolerated treatment well Patient left: in bed;with call bell/phone within reach;with family/visitor present (sitting EOB) Nurse Communication: Mobility status (Encouraged ambulation with nursing in hallway)         Time: 1923-1950 PT Time Calculation (min) (ACUTE ONLY): 27 min   Charges:   PT Evaluation $PT Eval Moderate Complexity: 1 Procedure PT Treatments $Gait Training: 8-22 mins   PT G CodesVena Austria May 11, 2016, 8:08 PM Durenda Hurt. Renaldo Fiddler, [facility] Acute Rehab Services Pager [phone]

## 2016-04-13 NOTE — Op Note (Signed)
04/13/2016  3:56 PM  PATIENT:  Devin Rios  75 y.o. 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, degenerative disc disease, radiculopathy  PROCEDURE:  Procedure(s): Redo Microdiscectomy - left - Lumbar three--Lumbar four (Left) with microdissection  SURGEON:  Surgeon(s) and Role:    * Virga Haltiwanger, MD - Primary  PHYSICIAN ASSISTANT: Ditty, MD  ASSISTANTS: Poteat, RN   ANESTHESIA:   general  EBL:  Total I/O In: 1250 [I.V.:1000; IV Piggyback:250] Out: 200 [Blood:200]  BLOOD ADMINISTERED:none  DRAINS: none   LOCAL MEDICATIONS USED:  MARCAINE    and LIDOCAINE   SPECIMEN:  No Specimen  DISPOSITION OF SPECIMEN:  N/A  COUNTS:  YES  TOURNIQUET:  * No tourniquets in log *  DICTATION:  DICTATION: Patient has a large recurrent herniated disc rupture on the left with significant left leg weakness and low back pain. It was elected to take him to surgery for redo left L 34 microdiscectomy on an urgent basis.  Procedure: Patient was brought to the operating room and following the smooth and uncomplicated induction of general endotracheal anesthesia he was placed in a prone position on the Wilson frame. Low back was prepped and draped in the usual sterile fashion with betadine scrub and DuraPrep. Preoperative localizing Xray was obtained.  Area of planned incision was infiltrated with local lidocaine. Incision was made in the midline and carried to the lumbodorsal fascia which was incised on the left side of midline. Subperiosteal dissection was performed exposing what was felt to be L 34 level. Intraoperative x-ray was obtained with marker at L 34 level.  The previous bony defect was defined and bone removal was extended in each direction with the high speed drill and completed with Kerrison rongeurs and a generous  foraminotomy was performed overlying the superior aspect of the L4 lamina. Scar tissue was detached and removed in a piecemeal fashion and under the microscope, and the lateral recess was thoroughly decompressed.  Neural elements were draped under pressure over this large herniation.  Using painstaking microdissection, I was able to mobilize these neural elements and then incised the capsule around the disc and removed multiple large fragments of disc material.  the L4 nerve root was decompressed laterally with removal of the superior aspect of the facet and ligamentum causing nerve root compression. There was a large amount of additional disc material which I removed, which extended into the interspace.  I was able at this point to mobilize the nerve medially and to palpate along its course with ball hooks and confirm that there were no additional compressive disc fragments. I cleared the interspace of additional loose disc material. The interspace was then irrigated with saline and no additional disc material was mobilized. Hemostasis was assured with bipolar electrocautery and the interspace was irrigated with Depo-Medrol and fentanyl. The lumbodorsal fascia was closed with 0 Vicryl sutures the subcutaneous tissues reapproximated 2-0 Vicryl inverted sutures and the skin edges were reapproximated with 3-0 Vicryl subcuticular stitch. The wound is dressed with Dermabond and an occlusive dressing. Patient was extubated in the operating room and taken to recovery in stable and satisfactory condition having tolerated his operation well. Counts were correct at the end of the case.  PLAN OF CARE: Admit to inpatient   PATIENT DISPOSITION:  PACU - hemodynamically stable.   Delay start of Pharmacological VTE agent (>24hrs) due to surgical blood   loss or risk of bleeding: yes

## 2016-04-13 NOTE — Anesthesia Postprocedure Evaluation (Signed)
Anesthesia Post Note  Patient: Devin Rios  Procedure(s) Performed: Procedure(s) (LRB): Redo Microdiscectomy - left - Lumbar three--Lumbar four (Left)  Patient location during evaluation: PACU Anesthesia Type: General Level of consciousness: awake, oriented and patient cooperative Pain management: pain level controlled Vital Signs Assessment: post-procedure vital signs reviewed and stable Respiratory status: spontaneous breathing and respiratory function stable Cardiovascular status: blood pressure returned to baseline Anesthetic complications: no    Last Vitals:  Filed Vitals:   04/13/16 0917 04/13/16 1535  BP: 109/77   Pulse: 72   Temp: 36.6 C 36.3 C  Resp: 20     Last Pain:  Filed Vitals:   04/13/16 1543  PainSc: Asleep                 Lyndzee Kliebert EDWARD

## 2016-04-13 NOTE — Progress Notes (Signed)
Awake, alert, conversant.  MAEW with good strength.  Leg pain resolved.  Doing well.

## 2016-04-13 NOTE — Brief Op Note (Signed)
04/13/2016  3:56 PM  PATIENT:  Devin Rios  75 y.o. 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, degenerative disc disease, radiculopathy  PROCEDURE:  Procedure(s): Redo Microdiscectomy - left - Lumbar three--Lumbar four (Left) with microdissection  SURGEON:  Surgeon(s) and Role:    * Maeola Harman, MD - Primary  PHYSICIAN ASSISTANT: Ditty, MD  ASSISTANTS: Poteat, RN   ANESTHESIA:   general  EBL:  Total I/O In: 1250 [I.V.:1000; IV Piggyback:250] Out: 200 [Blood:200]  BLOOD ADMINISTERED:none  DRAINS: none   LOCAL MEDICATIONS USED:  MARCAINE    and LIDOCAINE   SPECIMEN:  No Specimen  DISPOSITION OF SPECIMEN:  N/A  COUNTS:  YES  TOURNIQUET:  * No tourniquets in log *  DICTATION:  DICTATION: Patient has a large recurrent herniated disc rupture on the left with significant left leg weakness and low back pain. It was elected to take him to surgery for redo left L 34 microdiscectomy on an urgent basis.  Procedure: Patient was brought to the operating room and following the smooth and uncomplicated induction of general endotracheal anesthesia he was placed in a prone position on the Wilson frame. Low back was prepped and draped in the usual sterile fashion with betadine scrub and DuraPrep. Preoperative localizing Xray was obtained.  Area of planned incision was infiltrated with local lidocaine. Incision was made in the midline and carried to the lumbodorsal fascia which was incised on the left side of midline. Subperiosteal dissection was performed exposing what was felt to be L 34 level. Intraoperative x-ray was obtained with marker at L 34 level.  The previous bony defect was defined and bone removal was extended in each direction with the high speed drill and completed with Kerrison rongeurs and a generous  foraminotomy was performed overlying the superior aspect of the L4 lamina. Scar tissue was detached and removed in a piecemeal fashion and under the microscope, and the lateral recess was thoroughly decompressed.  Neural elements were draped under pressure over this large herniation.  Using painstaking microdissection, I was able to mobilize these neural elements and then incised the capsule around the disc and removed multiple large fragments of disc material.  the L4 nerve root was decompressed laterally with removal of the superior aspect of the facet and ligamentum causing nerve root compression. There was a large amount of additional disc material which I removed, which extended into the interspace.  I was able at this point to mobilize the nerve medially and to palpate along its course with ball hooks and confirm that there were no additional compressive disc fragments. I cleared the interspace of additional loose disc material. The interspace was then irrigated with saline and no additional disc material was mobilized. Hemostasis was assured with bipolar electrocautery and the interspace was irrigated with Depo-Medrol and fentanyl. The lumbodorsal fascia was closed with 0 Vicryl sutures the subcutaneous tissues reapproximated 2-0 Vicryl inverted sutures and the skin edges were reapproximated with 3-0 Vicryl subcuticular stitch. The wound is dressed with Dermabond and an occlusive dressing. Patient was extubated in the operating room and taken to recovery in stable and satisfactory condition having tolerated his operation well. Counts were correct at the end of the case.  PLAN OF CARE: Admit to inpatient   PATIENT DISPOSITION:  PACU - hemodynamically stable.   Delay start of Pharmacological VTE agent (>24hrs) due to surgical blood  loss or risk of bleeding: yes

## 2016-04-13 NOTE — Anesthesia Procedure Notes (Signed)
Procedure Name: Intubation Date/Time: 04/13/2016 1:35 PM Performed by: Fransisca Kaufmann Pre-anesthesia Checklist: Patient identified, Emergency Drugs available, Suction available and Patient being monitored Patient Re-evaluated:Patient Re-evaluated prior to inductionOxygen Delivery Method: Circle System Utilized Preoxygenation: Pre-oxygenation with 100% oxygen Intubation Type: IV induction Ventilation: Mask ventilation without difficulty Laryngoscope Size: Miller and 3 Grade View: Grade I Tube type: Oral Tube size: 8.0 mm Number of attempts: 1 Airway Equipment and Method: Stylet and Oral airway Placement Confirmation: ETT inserted through vocal cords under direct vision,  positive ETCO2 and breath sounds checked- equal and bilateral Secured at: 23 cm Tube secured with: Tape Dental Injury: Teeth and Oropharynx as per pre-operative assessment

## 2016-04-14 DIAGNOSIS — M5440 Lumbago with sciatica, unspecified side: Secondary | ICD-10-CM | POA: Diagnosis not present

## 2016-04-14 DIAGNOSIS — M5116 Intervertebral disc disorders with radiculopathy, lumbar region: Secondary | ICD-10-CM | POA: Diagnosis not present

## 2016-04-14 MED ORDER — METHOCARBAMOL 750 MG PO TABS: 750.0000 mg | ORAL_TABLET | Freq: Four times a day (QID) | ORAL | Status: DC | PRN

## 2016-04-14 MED ORDER — HYDROCODONE-ACETAMINOPHEN 10-325 MG PO TABS: 1.0000 | ORAL_TABLET | ORAL | Status: DC | PRN

## 2016-04-14 NOTE — Discharge Summary (Signed)
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  They were discharged in stable condition.  Follow up: 3 weeks    Medication List    ASK your doctor about these medications        amLODipine 5 MG tablet  Commonly known as:  NORVASC  Take 5 mg by mouth daily before breakfast.     baclofen 10 MG tablet  Commonly known as:  LIORESAL  Take 10 mg by mouth 2 (two) times daily as needed for muscle spasms.     benazepril 20 MG tablet  Commonly known as:  LOTENSIN  Take 20 mg by mouth daily.     carvedilol 25 MG tablet  Commonly known as:  COREG  Take 25 mg by mouth daily.     CORICIDIN HBP CONGESTION/COUGH 10-200 MG Caps  Generic drug:  Dextromethorphan-Guaifenesin  Take 1 capsule by mouth daily as needed (for cough and congestion).     docusate sodium 100 MG capsule  Commonly known as:  COLACE  Take 100 mg by mouth daily as needed for mild constipation.     ELIQUIS 2.5 MG Tabs tablet  Generic drug:  apixaban  Take 2.5 mg by mouth 2 (two) times daily.     ELIQUIS 5 MG Tabs tablet  Generic drug:  apixaban  Take 5 mg by mouth 2 (two) times daily.     furosemide 40 MG tablet  Commonly known as:  LASIX  Take 40 mg by mouth every other day.     GLIPIZIDE XL 10 MG 24 hr tablet  Generic drug:  glipiZIDE  Take 10 mg by mouth daily with breakfast.     HYDROcodone-acetaminophen 10-325 MG tablet  Commonly known as:  NORCO  Take 1 tablet by mouth every 4 (four)  hours as needed (pain).     spironolactone 25 MG tablet  Commonly known as:  ALDACTONE  Take 25 mg by mouth daily.     Vitamin D3 5000 units Caps  Take 5,000 Units by mouth every other day.

## 2016-04-14 NOTE — Evaluation (Signed)
Occupational Therapy Evaluation Patient Details Name: Devin Rios MRN: [id number] DOB: 11-06-1940 Today's Date: 04/14/2016    History of Present Illness Patient is a 75 yo male admitted 04/13/16 with back pain.  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  Patient now s/p Redo Microdiscectomy - left - Lumbar three--Lumbar four (Left) with microdissection.   PMH: morbid obesity, HTN, CHF, PVD, Afib, OSA, DM, renal insufficiency, arthritis, back pain, back surgery x2.   Clinical Impression   Pt reports he occasionally required assist with socks/shoes PTA but was otherwise independent with ADLs. Pt currently supervision for safety with ADLs and functional mobility with the exception of min assist for LB ADLs. All back, safety, and ADL education completed. Pt planning to d/c home with 24/7 supervision from his wife. No further acute OT needs identified; signing off at this time. Please re-consult if needs change. Thank you for this referral.    Follow Up Recommendations  No OT follow up;Supervision/Assistance - 24 hour (initially)    Equipment Recommendations  None recommended by OT    Recommendations for Other Services       Precautions / Restrictions Precautions Precautions: Back Precaution Comments: Pt able to verbally recall 3/3 back precautions and maintain throughout all activities in session. Restrictions Weight Bearing Restrictions: No      Mobility Bed Mobility               General bed mobility comments: Pt sitting EOB upon arrival.  Transfers Overall transfer level: Modified independent Equipment used: None                  Balance Overall balance assessment: Needs assistance Sitting-balance support: Feet supported;No upper extremity supported Sitting balance-Leahy Scale: Good     Standing balance support: No upper extremity supported;During functional activity Standing balance-Leahy Scale: Good                              ADL Overall ADL's : Needs assistance/impaired Eating/Feeding: Independent;Sitting   Grooming: Supervision/safety;Standing Grooming Details (indicate cue type and reason): Educated pt on use of 2 cups for oral care. Upper Body Bathing: Set up;Sitting   Lower Body Bathing: Minimal assistance;Sit to/from stand   Upper Body Dressing : Set up;Sitting   Lower Body Dressing: Minimal assistance;Sit to/from stand Lower Body Dressing Details (indicate cue type and reason): Wife to assist with LB ADLs as needed. Toilet Transfer: Supervision/safety;Ambulation;BSC;RW Toilet Transfer Details (indicate cue type and reason): Simulated by sit to stand from EOB Toileting- Clothing Manipulation and Hygiene: Supervision/safety;Sit to/from stand   Tub/ Shower Transfer: Supervision/safety;Tub transfer;Ambulation;Shower Field seismologist Details (indicate cue type and reason): Pt able to demo correct tub transfer technique. Functional mobility during ADLs: Supervision/safety;Rolling walker General ADL Comments: Pt reports this is his 3rd back sx; recalls a lot of education from prior sxs. Reviewed maintaining precautions during functional activities, keeping frequently used items at counter top height.     Vision Vision Assessment?: No apparent visual deficits   Perception     Praxis      Pertinent Vitals/Pain Pain Assessment: Faces Faces Pain Scale: Hurts a little bit Pain Location: back Pain Descriptors / Indicators: Sore Pain Intervention(s): Monitored during session     Hand Dominance     Extremity/Trunk Assessment Upper Extremity Assessment Upper Extremity Assessment: Overall WFL for tasks assessed   Lower Extremity Assessment Lower Extremity Assessment: Defer to PT evaluation  Cervical / Trunk Assessment Cervical / Trunk Assessment: Other exceptions Cervical / Trunk Exceptions: s/p lumbar sx   Communication Communication Communication: No  difficulties   Cognition Arousal/Alertness: Awake/alert Behavior During Therapy: WFL for tasks assessed/performed Overall Cognitive Status: Within Functional Limits for tasks assessed                     General Comments       Exercises       Shoulder Instructions      Home Living Family/patient expects to be discharged to:: Private residence Living Arrangements: Spouse/significant other Available Help at Discharge: Family;Available 24 hours/day Type of Home: House Home Access: Stairs to enter [employer] of Steps: 3 Entrance Stairs-Rails: Right;Left Home Layout: Multi-level;Able to live on main level with bedroom/bathroom (has stair lift to go upstairs)     Bathroom Shower/Tub: Tub/shower unit Shower/tub characteristics: Engineer, building services: Handicapped height (one standard but has 3 in 1 over toilet) Bathroom Accessibility: Yes How Accessible: Accessible via walker Home Equipment: Walker - 4 wheels;Cane - single point;Shower seat;Bedside commode          Prior Functioning/Environment Level of Independence: Independent with assistive device(s)        Comments: Using rollator for ambulation. Assist from wife with socks/shoes.    OT Diagnosis: Acute pain;Generalized weakness   OT Problem List:     OT Treatment/Interventions:      OT Goals(Current goals can be found in the care plan section) Acute Rehab OT Goals Patient Stated Goal: home today OT Goal Formulation: All assessment and education complete, DC therapy  OT Frequency:     Barriers to D/C:            Co-evaluation              End of Session Nurse Communication: Other (comment) (pt ready to d/c from OT standpoint)  Activity Tolerance: Patient tolerated treatment well Patient left: in bed;with call bell/phone within reach;with family/visitor present (sitting EOB)   Time: 5784-6962 OT Time Calculation (min): 11 min Charges:  OT General Charges $OT Visit: 1  Procedure OT Evaluation $OT Eval Low Complexity: 1 Procedure G-Codes:     Gaye Alken M.S., OTR/L Pager: [phone]  04/14/2016, 8:56 AM

## 2016-04-14 NOTE — Progress Notes (Signed)
No acute events AVSS Awake and alert Moving legs well Incision looks good Ready for d/c

## 2016-04-14 NOTE — Progress Notes (Signed)
Patient alert and oriented, mae's well, voiding adequate amount of urine, swallowing without difficulty, c/o mild pain. Patient discharged home with family. Script and discharged instructions given to patient. Patient and family stated understanding of d/c instructions given and has an appointment with MD.   

## 2016-04-16 ENCOUNTER — Encounter (HOSPITAL_COMMUNITY): Payer: Self-pay | Admitting: Neurosurgery

## 2016-04-25 NOTE — Progress Notes (Signed)
Late entry for missed g-code   04/14/16 0848  OT Time Calculation  OT Start Time (ACUTE ONLY) 0816  OT Stop Time (ACUTE ONLY) 0827  OT Time Calculation (min) 11 min  OT G-codes **NOT FOR INPATIENT CLASS**  Functional Assessment Tool Used Clinical judgement  Functional Limitation Self care  Self Care Current Status (D6644) CI  Self Care Goal Status (I3474) CI  Self Care Discharge Status (Q5956) CI  OT General Charges  $OT Visit 1 Procedure  OT Evaluation  $OT Eval Low Complexity 1 Procedure   Hurshel Party M.S., OTR/L Pager: [phone]

## 2016-04-26 NOTE — Progress Notes (Signed)
Physical Therapy Progress Note Addendum for G-codes   28-Apr-2016 1955  PT Visit Information  Last PT Received On 2016/05/11  PT G-Codes **NOT FOR INPATIENT CLASS**  Functional Assessment Tool Used Clinical judgement  Functional Limitation Mobility: Walking and moving around  Mobility: Walking and Moving Around Current Status [phone]) CI  Mobility: Walking and Moving Around Goal Status [phone]) CI  Mobility: Walking and Moving Around Discharge Status [phone]) CI  Durenda Hurt. Renaldo Fiddler, [facility] Acute Rehab Services Pager [phone]

## 2016-05-17 DIAGNOSIS — N179 Acute kidney failure, unspecified: Secondary | ICD-10-CM

## 2016-05-17 DIAGNOSIS — E871 Hypo-osmolality and hyponatremia: Secondary | ICD-10-CM

## 2016-05-17 DIAGNOSIS — N411 Chronic prostatitis: Secondary | ICD-10-CM

## 2016-05-17 DIAGNOSIS — D72829 Elevated white blood cell count, unspecified: Secondary | ICD-10-CM | POA: Insufficient documentation

## 2016-05-17 DIAGNOSIS — N189 Chronic kidney disease, unspecified: Secondary | ICD-10-CM | POA: Insufficient documentation

## 2016-05-17 HISTORY — DX: Acute kidney failure, unspecified: N17.9

## 2016-05-17 HISTORY — DX: Elevated white blood cell count, unspecified: D72.829

## 2016-05-17 HISTORY — DX: Hypo-osmolality and hyponatremia: E87.1

## 2016-05-17 HISTORY — DX: Chronic prostatitis: N41.1

## 2016-05-17 HISTORY — DX: Chronic kidney disease, unspecified: N18.9

## 2016-05-22 DIAGNOSIS — I5031 Acute diastolic (congestive) heart failure: Secondary | ICD-10-CM | POA: Insufficient documentation

## 2016-05-22 HISTORY — DX: Acute diastolic (congestive) heart failure: I50.31

## 2017-01-11 DIAGNOSIS — Z6841 Body Mass Index (BMI) 40.0 and over, adult: Secondary | ICD-10-CM

## 2017-01-11 HISTORY — DX: Body Mass Index (BMI) 40.0 and over, adult: Z684

## 2017-01-11 HISTORY — DX: Morbid (severe) obesity due to excess calories: E66.01

## 2017-12-19 IMAGING — CR DG LUMBAR SPINE 2-3V
2 series · 2 of 2 positions shown · non-contrast
Comparison: MRI lumbar spine 03/26/2016 at [facility] [REDACTED].

CLINICAL DATA: Left L3-4 redo microdiscectomy.  Elective surgery.

EXAM:
LUMBAR SPINE - 2-3 VIEW

[lat (1 of 2)]
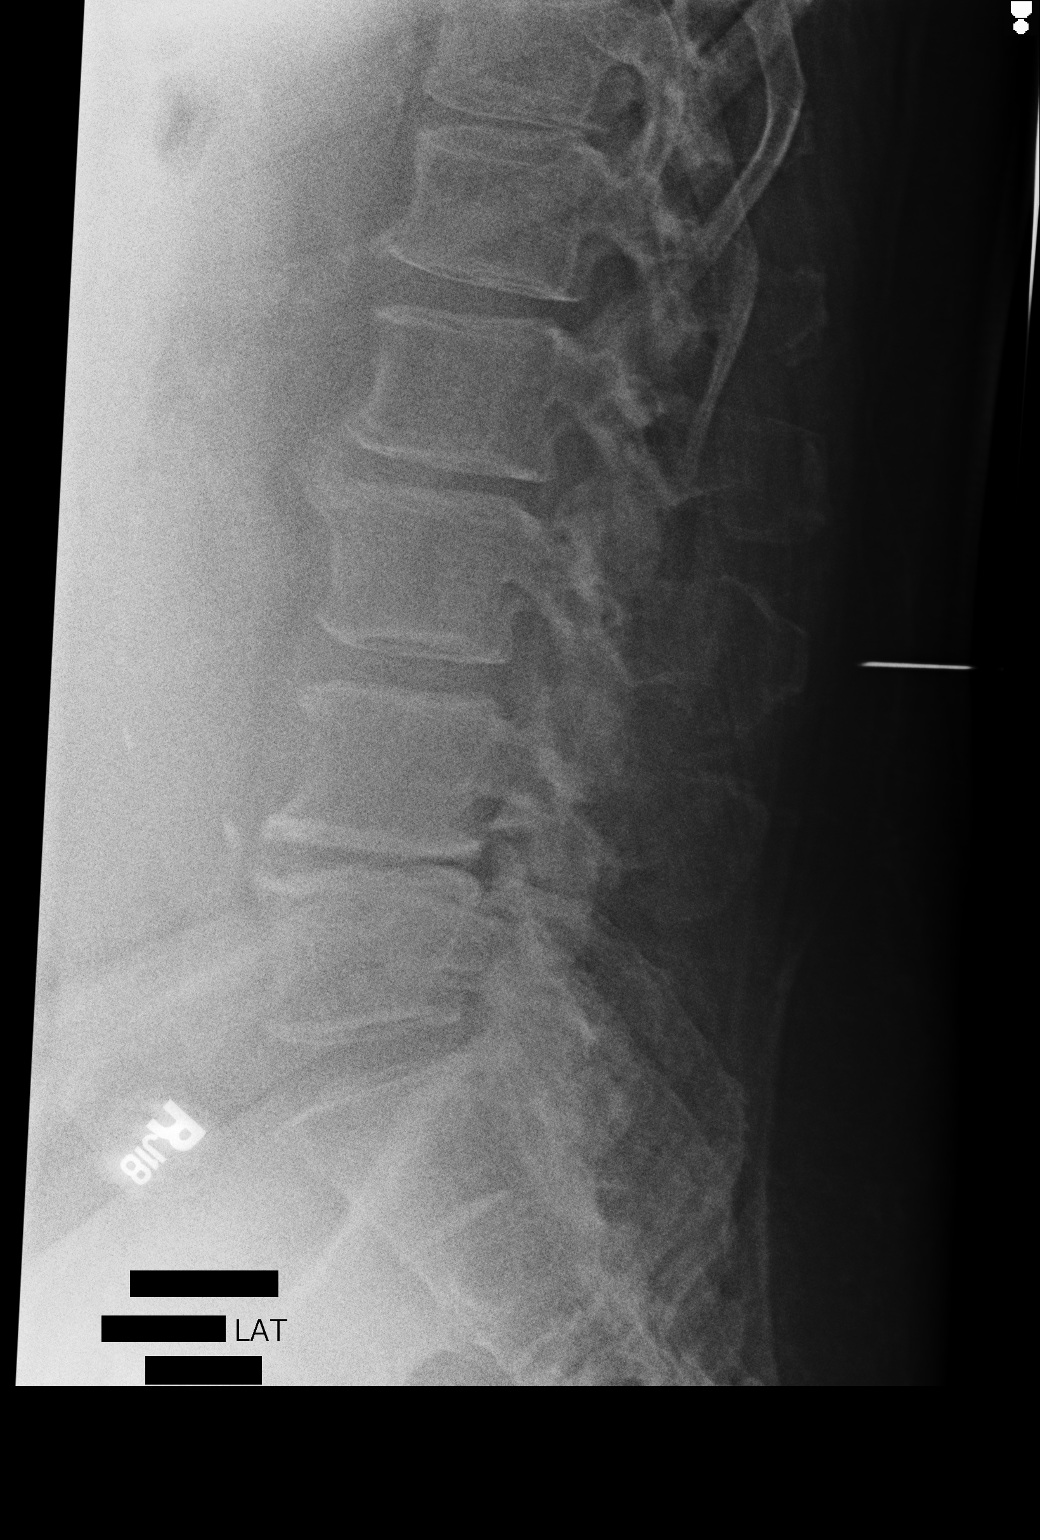

[lat (2 of 2)]
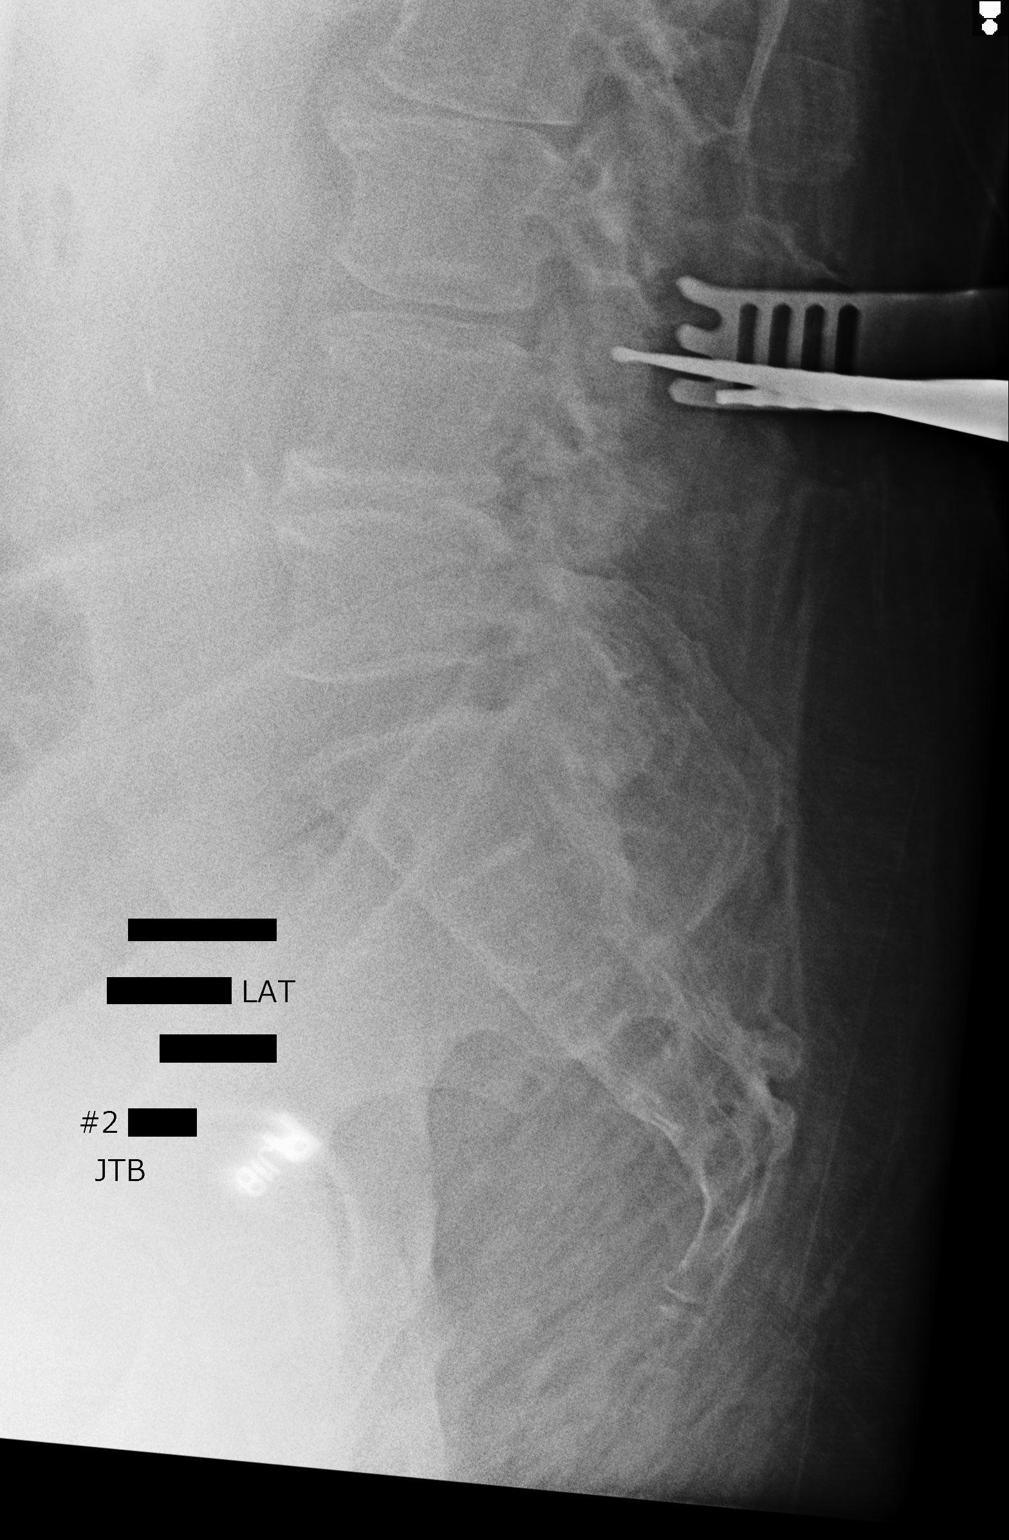

[2 of 2 positions shown; findings below may reference images not displayed]

FINDINGS: 2 intraoperative films are submitted.

Film #1 demonstrates a needle directed at the L3 spinous process.

Film #2 demonstrates a surgical probe directed at the L3-4
interspace.
IMPRESSION: Intraoperative localization of the L3-4 disc space

## 2018-01-07 ENCOUNTER — Encounter: Payer: Self-pay | Admitting: Cardiology

## 2018-01-07 DIAGNOSIS — N183 Chronic kidney disease, stage 3 unspecified: Secondary | ICD-10-CM

## 2018-01-07 DIAGNOSIS — Z7901 Long term (current) use of anticoagulants: Secondary | ICD-10-CM

## 2018-01-07 DIAGNOSIS — I119 Hypertensive heart disease without heart failure: Secondary | ICD-10-CM

## 2018-01-07 DIAGNOSIS — I482 Chronic atrial fibrillation, unspecified: Secondary | ICD-10-CM

## 2018-01-07 DIAGNOSIS — I872 Venous insufficiency (chronic) (peripheral): Secondary | ICD-10-CM

## 2018-07-11 ENCOUNTER — Ambulatory Visit: Payer: Medicare Other | Admitting: Cardiology

## 2018-07-14 ENCOUNTER — Encounter: Payer: Self-pay | Admitting: Cardiology

## 2018-07-14 DIAGNOSIS — I872 Venous insufficiency (chronic) (peripheral): Secondary | ICD-10-CM | POA: Insufficient documentation

## 2018-07-14 DIAGNOSIS — I482 Chronic atrial fibrillation, unspecified: Secondary | ICD-10-CM | POA: Insufficient documentation

## 2018-07-14 DIAGNOSIS — I119 Hypertensive heart disease without heart failure: Secondary | ICD-10-CM

## 2018-07-14 DIAGNOSIS — N183 Chronic kidney disease, stage 3 unspecified: Secondary | ICD-10-CM | POA: Insufficient documentation

## 2018-07-14 DIAGNOSIS — Z7901 Long term (current) use of anticoagulants: Secondary | ICD-10-CM

## 2018-07-14 HISTORY — DX: Hypertensive heart disease without heart failure: I11.9

## 2018-07-14 HISTORY — DX: Morbid (severe) obesity due to excess calories: E66.01

## 2018-07-14 HISTORY — DX: Chronic kidney disease, stage 3 unspecified: N18.30

## 2018-07-14 HISTORY — DX: Chronic atrial fibrillation, unspecified: I48.20

## 2018-07-14 HISTORY — DX: Long term (current) use of anticoagulants: Z79.01

## 2018-07-14 NOTE — Progress Notes (Signed)
Date of visit:  01/07/2018 DOB:  11-15-1940    Age:  77 yrs. Medical record number:  [id number]     Account number:  [id number] Primary Care Provider: Bartholome Bill K ____________________________ CURRENT DIAGNOSES  1. Hypertensive heart disease without heart failure  2. Other cardiomyopathies  3. Chronic atrial fibrillation  4. Long term (current) use of anticoagulants  5. Type 2 diabetes mellitus without complications  6. Sleep apnea  7. Morbid (severe) obesity  8. Chronic kidney disease, stage 3 (moderate) ____________________________ ALLERGIES  Codeine, Nausea ____________________________ MEDICATIONS  1. amlodipine 10 mg tablet, 1 p.o. daily  2. Coricidin HBP 10-200 mg capsule, PRN  3. Dulcolax Stool Softener (docusate) 100 mg capsule, PRN  4. Eliquis 5 mg tablet, BID  5. finasteride 5 mg tablet, 1 p.o. daily  6. Flomax 0.4 mg capsule, 1 p.o. daily  7. furosemide 40 mg tablet, three times a week  8. glipizide 10 mg Tablet, 1 p.o. daily  9. hydrocodone 10 mg-acetaminophen 325 mg tablet, PRN  10. metoprolol succinate ER 100 mg tablet,extended release 24 hr, BID  11. Vitamin D3 5,000 unit tablet, 1 p.o. daily ____________________________ HISTORY OF PRESENT ILLNESS Patient returns for cardiac followup. He appears to have gotten over the complications of his retinal detachment. He is not currently having shortness of breath or edema. He has been losing significant weight since he was here. He denies angina and has no PND, orthopnea, syncope, or claudication. He has no bleeding complications from anticoagulation. ____________________________ PAST HISTORY  Past Medical Illnesses:  hypertension, DM-non-insulin dependent, lumbar disc disease, morbid obesity, osteoarthritis, history of shingles, chronic kidney disease Stage 3, chronic venous insufficiency, BPH, history of DVT after knee surgery, sleep apnea;  Cardiovascular Illnesses:  atrial fibrillation, cardiomyopathy;  Surgical Procedures:   lumbar laminectomy, hemmoroidectomy, shoulder surgery, tonsillectomy, left eye surgery;  NYHA Classification:  II;  Canadian Angina Classification:  Class 0: Asymptomatic;  Cardiology Procedures-Invasive:  no previous interventional or invasive cardiology procedures;  Cardiology Procedures-Noninvasive:  echocardiogram May 2012, lexiscan cardiolite August 2012, echocardiogram April 2013, echocardiogram November 2014, echocardiogram July 2017;  Peripheral Vascular Procedures:  ABI 04/13/15 normal;  LVEF of 40-45% documented via echocardiogram on 09/19/2015,  CHADS Score:  2,  CHA2DS2-VASC Score:  3 ____________________________ CARDIO-PULMONARY TEST DATES EKG Date:  01/07/2018;  Nuclear Study Date:  04/12/2011;  Echocardiography Date: 09/19/2015;  Chest Xray Date: 04/13/2015;   ____________________________ FAMILY HISTORY Brother -- Brother dead, Diabetes mellitus, Congestive heart failure, Chronic obstructive lung disease, Aortic aneurysm Father -- Father dead, Diabetes mellitus, Hepatitis Mother -- Mother dead, CVA ____________________________ SOCIAL HISTORY Alcohol Use:  no alcohol use;  Smoking:  used to smoke but quit Prior to 1980, 15 pack year history;  Diet:  regular diet;  Lifestyle:  divorced x 2 and remarried;  Exercise:  exercise is limited due to physical disability;  Occupation:  retired Presenter, broadcasting and retired [employer];  Residence:  lives with wife;   ____________________________ REVIEW OF SYSTEMS General:  obesity, no change in weight Eyes: wears eye glasses/contact lenses, retinal detachment with recent surgery Ears, Nose, Throat, Mouth:  tinnitus, partial hearing loss, seasonal sinusitis Respiratory: mild dyspnea with exertion Cardiovascular:  please review HPI Abdominal: denies dyspepsia, GI bleeding, constipation, or diarrhea Genitourinary-Male: erectile dysfunction, nocturia, recent UTI  Musculoskeletal:  chronic low back pain, arthritis of the hands, arthritis of the  right ankle  ____________________________ PHYSICAL EXAMINATION VITAL SIGNS  Blood Pressure:  120/80 Sitting, Left arm, large cuff  , 120/90 Standing, Left arm  and large cuff   Pulse:  74/min. Weight:  306.00 lbs. Height:  72.00"BMI: 41  Constitutional:  pleasant white male in no acute distress, severely obese Skin:  warm and dry to touch, no apparent skin lesions, or masses noted. Head:  normocephalic, normal hair pattern, no masses or tenderness Neck:  supple, without massess. No JVD, thyromegaly or carotid bruits. Carotid upstroke normal. Chest:  normal symmetry, clear to auscultation. Cardiac:  irregular rhythm, normal S1 and S2, no S3 or S4 Peripheral Pulses:  the femoral,dorsalis pedis, and posterior tibial pulses are full and equal bilaterally with no bruits auscultated. Extremities & Back:  no spinal abnormalities noted., no edema present Neurological:  no gross motor or sensory deficits noted, affect appropriate, oriented x3. ____________________________ MOST RECENT LIPID PANEL 12/14/15  CHOL TOTL 157 mg/dl, LDL 97 NM, HDL 41 mg/dl, TRIGLYCER 97 mg/dl, ALT 18 u/l, ALK PHOS 59 u/l and AST 18 u/l ____________________________ IMPRESSIONS/PLAN  1. Chronic atrial fibrillation currently rate controlled 2. Hypertensive heart disease controlled 3. Long-term use of anticoagulation without complications 4. Morbid obesity with need to lose weight 5. History of cardiomyopathy  Recommendations:  He mentioned his son's death today that we spoke about. Tolerating anticoagulation well and has had a recent 6 months. Recommended followup and 6 months. 12 EKG personally reviewed shows atrial fibrillation with controlled response. ____________________________ TODAYS ORDERS  1. 2D, color flow, doppler: 6 months  2. 12 Lead EKG: Today  3. Return Visit: 6 months                       ____________________________ Cardiology Physician:  Kerry Hough MD [facility]

## 2018-07-18 ENCOUNTER — Encounter: Payer: Self-pay | Admitting: Cardiology

## 2018-07-18 ENCOUNTER — Ambulatory Visit (INDEPENDENT_AMBULATORY_CARE_PROVIDER_SITE_OTHER): Payer: Medicare Other | Admitting: Cardiology

## 2018-07-18 VITALS — BP 120/74 | HR 72 | Ht 72.0 in | Wt 295.8 lb

## 2018-07-18 DIAGNOSIS — Z7901 Long term (current) use of anticoagulants: Secondary | ICD-10-CM | POA: Diagnosis not present

## 2018-07-18 DIAGNOSIS — I482 Chronic atrial fibrillation, unspecified: Secondary | ICD-10-CM | POA: Diagnosis not present

## 2018-07-18 DIAGNOSIS — I420 Dilated cardiomyopathy: Secondary | ICD-10-CM

## 2018-07-18 DIAGNOSIS — I119 Hypertensive heart disease without heart failure: Secondary | ICD-10-CM | POA: Diagnosis not present

## 2018-07-18 HISTORY — DX: Dilated cardiomyopathy: I42.0

## 2018-07-18 NOTE — Progress Notes (Signed)
Cardiology Office Note:    Date:  07/18/2018   ID:  Devin Rios, DOB 01-19-1941, MRN [id number]  PCP:  Dan Maker, MD  Cardiologist:  Gypsy Balsam, MD    Referring MD: Dan Maker, MD   Chief Complaint  Patient presents with  . Follow-up  Doing well  History of Present Illness:    Devin Rios is a 77 y.o. male who is a patient of Dr. Donnie Aho.  I see this patient during Dr. Donnie Aho absence.  Overall he is doing well denies have any chest pain tightness squeezing pressure when she has because of chronic back problem he does not do much.  He lives fairly sedentary lifestyle no palpitations no chest pain tightness squeezing pressure burning chest.  His past medical history is significant for permanent atrial fibrillation.  He is anticoagulated with Eliquis which I will continue in the chart I find out that he does have history of cardiomyopathy with ejection fraction 4045% by echocardiogram from 2017.  I think he deserves to have another echocardiogram to look at ejection fraction again.  He also does have sleep apnea however for some reason he did not comply with schedule CPAP.  Past Medical History:  Diagnosis Date  . Arthritis    chronic back, knee, shoulder pain  . Atrial fibrillation (HCC)   . Cardiomyopathy (HCC)   . Cataract, immature    right eye  . Chronic kidney disease    Stage 3  . Chronic venous insufficiency   . Depression    due to limited mobility related to back problems  . Diabetes mellitus   . Dysrhythmia    afib- sees Dr. Donnie Aho, on Eliquis, will be  holding for surgery 05/2014  . History of DVT (deep vein thrombosis)   . Hypertension    stress test done, early 2015, wnl  . Hypertensive heart disease   . Kidney stones   . Neuromuscular disorder (HCC)    lumbar HNP & stenosis   . Nonobstructive cardiomyopathy (HCC)   . Peripheral vascular disease (HCC)    blood clot in leg- after knees arthroscope- R leg   . Shortness of breath    pt. attributes to afib  . Sleep apnea 8/12     pt denies; no cpap;dx by Dr. Shelle Iron  . Sleep apnea   . Urethral stricture     Past Surgical History:  Procedure Laterality Date  . BACK SURGERY    . COLONOSCOPY     x 2  . CYSTOSCOPY WITH URETHRAL DILATATION  10/28/2012   Procedure: CYSTOSCOPY WITH URETHRAL DILATATION;  Surgeon: Antony Haste, MD;  [facility];  Service: Urology;  Laterality: N/A;  visual dilatation  . HEMORROIDECTOMY    . KNEE SURGERY Right   . LUMBAR LAMINECTOMY/DECOMPRESSION MICRODISCECTOMY Left 05/28/2014   Procedure: Left Lumbar Three-Four Microdiskectomy;  Surgeon: Maeola Harman, MD;  Location: [facility];  Service: Neurosurgery;  Laterality: Left;  Left L3-4 Microdiskectomy  . LUMBAR LAMINECTOMY/DECOMPRESSION MICRODISCECTOMY Left 04/13/2016   Procedure: Redo Microdiscectomy - left - Lumbar three--Lumbar four;  Surgeon: Maeola Harman, MD;  Location: [facility];  Service: Neurosurgery;  Laterality: Left;  . SHOULDER SURGERY Right   . TONSILLECTOMY    . VASECTOMY      Current Medications: Current Meds  Medication Sig  . amLODipine (NORVASC) 10 MG tablet Take 10 mg by mouth daily before breakfast.   . apixaban (ELIQUIS) 5 MG TABS tablet Take 5 mg by  mouth 2 (two) times daily.   . benazepril (LOTENSIN) 20 MG tablet Take 20 mg by mouth daily.  . Cholecalciferol (VITAMIN D3) 5000 UNITS CAPS Take 5,000 Units by mouth every other day.  Marland Kitchen Dextromethorphan-Guaifenesin (CORICIDIN HBP CONGESTION/COUGH) 10-200 MG CAPS Take 1 capsule by mouth daily as needed (for cough and congestion).  Marland Kitchen docusate sodium (COLACE) 100 MG capsule Take 100 mg by mouth daily as needed for mild constipation.  . finasteride (PROSCAR) 5 MG tablet Take 1 tablet by mouth daily.  . furosemide (LASIX) 40 MG tablet Take 40 mg by mouth 2 (two) times a week.   Marland Kitchen GLIPIZIDE XL 10 MG 24 hr tablet Take 10 mg by mouth daily with breakfast.   . HYDROcodone-acetaminophen (NORCO)  10-325 MG tablet Take 1 tablet by mouth every 4 (four) hours as needed (pain).  . metoprolol succinate (TOPROL-XL) 100 MG 24 hr tablet Take 1 tablet by mouth 2 (two) times daily.  . tamsulosin (FLOMAX) 0.4 MG CAPS capsule Take 1 capsule by mouth daily.     Allergies:   Tizanidine and Codeine   Social History   Socioeconomic History  . Marital status: Married    Spouse name: Not on file  . Number of children: Not on file  . Years of education: Not on file  . Highest education level: Not on file  Occupational History  . Occupation: Retired    Associate Professor: Psychologist, educational    Comment: Worked as Veterinary surgeon for [employer]  . Financial resource strain: Not on file  . Food insecurity:    Worry: Not on file    Inability: Not on file  . Transportation needs:    Medical: Not on file    Non-medical: Not on file  Tobacco Use  . Smoking status: Former Smoker    Packs/day: 2.50    Years: 17.00    Pack years: 42.50    Types: Cigarettes    Last attempt to quit: 10/02/1971    Years since quitting: 46.8  . Smokeless tobacco: Never Used  Substance and Sexual Activity  . Alcohol use: No    Comment: none since 1977, heavy drinker prior  . Drug use: No  . Sexual activity: Not on file  Lifestyle  . Physical activity:    Days per week: Not on file    Minutes per session: Not on file  . Stress: Not on file  Relationships  . Social connections:    Talks on phone: Not on file    Gets together: Not on file    Attends religious service: Not on file    Active member of club or organization: Not on file    Attends meetings of clubs or organizations: Not on file    Relationship status: Not on file  Other Topics Concern  . Not on file  Social History Narrative  . Not on file     Family History: The patient's family history includes Aortic aneurysm in his brother; COPD in his brother; Diabetes in his brother and father; Heart failure in his brother; Hepatitis in his father; Stroke in  his mother. ROS:   Please see the history of present illness.    All 14 point review of systems negative except as described per history of present illness  EKGs/Labs/Other Studies Reviewed:      Recent Labs: No results found for requested labs within last 8760 hours.  Recent Lipid Panel No results found for: CHOL, TRIG, HDL, CHOLHDL, VLDL, LDLCALC,  LDLDIRECT  Physical Exam:    VS:  BP 120/74   Pulse 72   Ht 6' (1.829 m)   Wt 295 lb 12.8 oz (134.2 kg)   SpO2 96%   BMI 40.12 kg/m     Wt Readings from Last 3 Encounters:  07/18/18 295 lb 12.8 oz (134.2 kg)  04/13/16 299 lb (135.6 kg)  04/06/16 299 lb 1.6 oz (135.7 kg)     GEN:  Well nourished, well developed in no acute distress HEENT: Normal NECK: No JVD; No carotid bruits LYMPHATICS: No lymphadenopathy CARDIAC: Irregularly irregular, no murmurs, no rubs, no gallops RESPIRATORY:  Clear to auscultation without rales, wheezing or rhonchi  ABDOMEN: Soft, non-tender, non-distended MUSCULOSKELETAL:  No edema; No deformity  SKIN: Warm and dry LOWER EXTREMITIES: no swelling NEUROLOGIC:  Alert and oriented x 3 PSYCHIATRIC:  Normal affect   ASSESSMENT:    1. Chronic atrial fibrillation   2. Hypertensive heart disease without CHF   3. Morbid obesity (HCC)   4. Long term current use of anticoagulant therapy   5. Dilated cardiomyopathy (HCC)    PLAN:    In order of problems listed above:  1. Chronic atrial fibrillation/permanent atrial fibrillation.  Patient is doing well asymptomatic rate appears to be controlled we will continue with anticoagulation his chads 2 Vascor equals 4. 2. Hypertensive heart disease we will do echocardiogram to assess left ventricular ejection fraction. 3. Morbid obesity he understands a problem try to work on weight loss.  Limited success.  Dilated cardiomyopathy echocardiogram to be done to reassess left ventricular ejection fraction. 4. Dyslipidemia I will request fasting lipid profile from  his primary care physician.  He tells me his cholesterol is good however I do have LDL 97 from 2017.  He will be seen back in about 5 months or sooner if he get a problem. 5.    Medication Adjustments/Labs and Tests Ordered: Current medicines are reviewed at length with the patient today.  Concerns regarding medicines are outlined above.  No orders of the defined types were placed in this encounter.  Medication changes: No orders of the defined types were placed in this encounter.   Signed, Georgeanna Lea, MD, [facility] 07/18/2018 11:36 AM    [facility]

## 2018-07-18 NOTE — Patient Instructions (Signed)
Medication Instructions:  Your physician recommends that you continue on your current medications as directed. Please refer to the Current Medication list given to you today.  If you need a refill on your cardiac medications before your next appointment, please call your pharmacy.   Lab work: None.  If you have labs (blood work) drawn today and your tests are completely normal, you will receive your results only by: . MyChart Message (if you have MyChart) OR . A paper copy in the mail If you have any lab test that is abnormal or we need to change your treatment, we will call you to review the results.  Testing/Procedures: Your physician has requested that you have an echocardiogram. Echocardiography is a painless test that uses sound waves to create images of your heart. It provides your doctor with information about the size and shape of your heart and how well your heart's chambers and valves are working. This procedure takes approximately one hour. There are no restrictions for this procedure.    Follow-Up: At [facility], you and your health needs are our priority.  As part of our continuing mission to provide you with exceptional heart care, we have created designated Provider Care Teams.  These Care Teams include your primary Cardiologist (physician) and Advanced Practice Providers (APPs -  Physician Assistants and Nurse Practitioners) who all work together to provide you with the care you need, when you need it. You will need a follow up appointment in 5 months.  Please call our office 2 months in advance to schedule this appointment.  You may see No primary care provider on file. or another member of our [facility] Provider Team in [place]: Brian Munley, MD . Rajan Revankar, MD  Any Other Special Instructions Will Be Listed Below (If Applicable).  Echocardiogram An echocardiogram, or echocardiography, uses sound waves (ultrasound) to produce an image of your heart. The  echocardiogram is simple, painless, obtained within a short period of time, and offers valuable information to your health care provider. The images from an echocardiogram can provide information such as:  Evidence of coronary artery disease (CAD).  Heart size.  Heart muscle function.  Heart valve function.  Aneurysm detection.  Evidence of a past heart attack.  Fluid buildup around the heart.  Heart muscle thickening.  Assess heart valve function.  Tell a health care provider about:  Any allergies you have.  All medicines you are taking, including vitamins, herbs, eye drops, creams, and over-the-counter medicines.  Any problems you or family members have had with anesthetic medicines.  Any blood disorders you have.  Any surgeries you have had.  Any medical conditions you have.  Whether you are pregnant or may be pregnant. What happens before the procedure? No special preparation is needed. Eat and drink normally. What happens during the procedure?  In order to produce an image of your heart, gel will be applied to your chest and a wand-like tool (transducer) will be moved over your chest. The gel will help transmit the sound waves from the transducer. The sound waves will harmlessly bounce off your heart to allow the heart images to be captured in real-time motion. These images will then be recorded.  You may need an IV to receive a medicine that improves the quality of the pictures. What happens after the procedure? You may return to your normal schedule including diet, activities, and medicines, unless your health care provider tells you otherwise. This information is not intended to replace advice   given to you by your health care provider. Make sure you discuss any questions you have with your health care provider. Document Released: 09/14/2000 Document Revised: 05/05/2016 Document Reviewed: 05/25/2013 Elsevier Interactive Patient Education  2017 Elsevier Inc.   

## 2018-09-04 ENCOUNTER — Ambulatory Visit (INDEPENDENT_AMBULATORY_CARE_PROVIDER_SITE_OTHER): Payer: Medicare Other

## 2018-09-04 DIAGNOSIS — I420 Dilated cardiomyopathy: Secondary | ICD-10-CM

## 2018-09-04 MED ORDER — PERFLUTREN LIPID MICROSPHERE: 1.0000 mL | INTRAVENOUS | Status: AC | PRN

## 2018-09-04 NOTE — Progress Notes (Signed)
Complete echocardiogram with contrast was performed.  Jimmy Eloy Fehl RDCS, RVT

## 2018-10-07 ENCOUNTER — Telehealth: Payer: Self-pay | Admitting: Cardiology

## 2018-10-07 NOTE — Telephone Encounter (Signed)
Wants toprol called to [facility]

## 2018-10-08 MED ORDER — METOPROLOL SUCCINATE ER 100 MG PO TB24: 100.0000 mg | ORAL_TABLET | Freq: Two times a day (BID) | ORAL | 1 refills | Status: DC

## 2018-10-08 NOTE — Addendum Note (Signed)
Addended by: Lita Mains on: 10/08/2018 10:06 AM   Modules accepted: Orders

## 2018-10-08 NOTE — Telephone Encounter (Signed)
Metoprolol succinate 100 mg twice daily refilled and sent to [facility].

## 2018-11-13 DIAGNOSIS — Z79891 Long term (current) use of opiate analgesic: Secondary | ICD-10-CM | POA: Insufficient documentation

## 2018-11-13 DIAGNOSIS — N400 Enlarged prostate without lower urinary tract symptoms: Secondary | ICD-10-CM

## 2018-11-13 DIAGNOSIS — Z7901 Long term (current) use of anticoagulants: Secondary | ICD-10-CM

## 2018-11-13 HISTORY — DX: Benign prostatic hyperplasia without lower urinary tract symptoms: N40.0

## 2018-11-13 HISTORY — DX: Morbid (severe) obesity due to excess calories: E66.01

## 2018-11-13 HISTORY — DX: Long term (current) use of anticoagulants: Z79.01

## 2018-11-13 HISTORY — DX: Long term (current) use of opiate analgesic: Z79.891

## 2019-01-12 ENCOUNTER — Telehealth: Payer: Self-pay | Admitting: Emergency Medicine

## 2019-01-12 NOTE — Telephone Encounter (Signed)
Patient confirmed televisit for Wed 01/14/2019.

## 2019-01-14 ENCOUNTER — Telehealth (INDEPENDENT_AMBULATORY_CARE_PROVIDER_SITE_OTHER): Payer: Medicare Other | Admitting: Cardiology

## 2019-01-14 ENCOUNTER — Other Ambulatory Visit: Payer: Self-pay

## 2019-01-14 ENCOUNTER — Encounter: Payer: Self-pay | Admitting: Cardiology

## 2019-01-14 VITALS — BP 122/76 | HR 80 | Wt 286.0 lb

## 2019-01-14 DIAGNOSIS — I482 Chronic atrial fibrillation, unspecified: Secondary | ICD-10-CM | POA: Diagnosis not present

## 2019-01-14 DIAGNOSIS — I420 Dilated cardiomyopathy: Secondary | ICD-10-CM

## 2019-01-14 DIAGNOSIS — Z7901 Long term (current) use of anticoagulants: Secondary | ICD-10-CM

## 2019-01-14 NOTE — Patient Instructions (Signed)
Medication Instructions:  Your physician recommends that you continue on your current medications as directed. Please refer to the Current Medication list given to you today.  If you need a refill on your cardiac medications before your next appointment, please call your pharmacy.   Lab work: None.   If you have labs (blood work) drawn today and your tests are completely normal, you will receive your results only by: . MyChart Message (if you have MyChart) OR . A paper copy in the mail If you have any lab test that is abnormal or we need to change your treatment, we will call you to review the results.  Testing/Procedures: None.   Follow-Up: At [facility], you and your health needs are our priority.  As part of our continuing mission to provide you with exceptional heart care, we have created designated Provider Care Teams.  These Care Teams include your primary Cardiologist (physician) and Advanced Practice Providers (APPs -  Physician Assistants and Nurse Practitioners) who all work together to provide you with the care you need, when you need it. You will need a follow up appointment in 6 months.  Please call our office 2 months in advance to schedule this appointment.  You may see No primary care provider on file. or another member of our [facility] Provider Team in [place]: Brian Munley, MD . Rajan Revankar, MD  Any Other Special Instructions Will Be Listed Below (If Applicable).    

## 2019-01-14 NOTE — Progress Notes (Signed)
Virtual Visit via Telephone Note   This visit type was conducted due to national recommendations for restrictions regarding the COVID-19 Pandemic (e.g. social distancing) in an effort to limit this patient's exposure and mitigate transmission in our community.  Due to his co-morbid illnesses, this patient is at least at moderate risk for complications without adequate follow up.  This format is felt to be most appropriate for this patient at this time.  The patient did not have access to video technology/had technical difficulties with video requiring transitioning to audio format only (telephone).  All issues noted in this document were discussed and addressed.  No physical exam could be performed with this format.  Please refer to the patient's chart for his  consent to telehealth [facility].  Evaluation Performed:  Follow-up visit  This visit type was conducted due to national recommendations for restrictions regarding the COVID-19 Pandemic (e.g. social distancing).  This format is felt to be most appropriate for this patient at this time.  All issues noted in this document were discussed and addressed.  No physical exam was performed (except for noted visual exam findings with Video Visits).  Please refer to the patient's chart (MyChart message for video visits and phone note for telephone visits) for the patient's consent to telehealth [facility].  Date:  01/14/2019  ID: Devin OkDouglas E Waldrep, DOB 08/19/1941, MRN [id number]   Patient Location:  [address]  [place] [id number]   Provider location[facility]   [facility] [place] [facility]  PCP:  Dan MakerPhilip, Mathews K, MD  Cardiologist:  Gypsy Balsamobert Eunique Balik, MD     Chief Complaint: Doing well History of Present Illness:    Devin Rios is a 78 y.o. male  who presents via audio/video conferencing for a telehealth visit today.  With history of permanent atrial fibrillation, hypertension, cardiomyopathy with ejection fraction 45% from  2017, however latest echocardiogram showed ejection fraction 50 to 55%.  He is doing well he is trying to stay home compliant with coronavirus restrictions.  He son who lives with him works in [employer] home [employer] have to go to work on the regular basis.  Denies having any chest pain tightness squeezing pressure burning chest he does get short of breath easily when he is trying to walk.  This is a chronic complaint no new changes.  No swelling of lower extremities no palpitations no dizziness.   The patient does not have symptoms concerning for COVID-19 infection (fever, chills, cough, or new SHORTNESS OF BREATH).    Prior CV studies:   The following studies were reviewed today:       Past Medical History:  Diagnosis Date  . Arthritis    chronic back, knee, shoulder pain  . Atrial fibrillation (HCC)   . Cardiomyopathy (HCC)   . Cataract, immature    right eye  . Chronic kidney disease    Stage 3  . Chronic venous insufficiency   . Depression    due to limited mobility related to back problems  . Diabetes mellitus   . Dysrhythmia    afib- sees Dr. Donnie Ahoilley, on Eliquis, will be  holding for surgery 05/2014  . History of DVT (deep vein thrombosis)   . Hypertension    stress test done, early 2015, wnl  . Hypertensive heart disease   . Kidney stones   . Neuromuscular disorder (HCC)    lumbar HNP & stenosis   . Nonobstructive cardiomyopathy (HCC)   . Peripheral vascular  disease (HCC)    blood clot in leg- after knees arthroscope- R leg   . Shortness of breath    pt. attributes to afib  . Sleep apnea 8/12     pt denies; no cpap;dx by Dr. Shelle Iron  . Sleep apnea   . Urethral stricture     Past Surgical History:  Procedure Laterality Date  . BACK SURGERY    . COLONOSCOPY     x 2  . CYSTOSCOPY WITH URETHRAL DILATATION  10/28/2012   Procedure: CYSTOSCOPY WITH URETHRAL DILATATION;  Surgeon: Antony Haste, MD;  Location[facility];  Service: Urology;   Laterality: N/A;  visual dilatation  . HEMORROIDECTOMY    . KNEE SURGERY Right   . LUMBAR LAMINECTOMY/DECOMPRESSION MICRODISCECTOMY Left 05/28/2014   Procedure: Left Lumbar Three-Four Microdiskectomy;  Surgeon: Maeola Harman, MD;  Location: [facility];  Service: Neurosurgery;  Laterality: Left;  Left L3-4 Microdiskectomy  . LUMBAR LAMINECTOMY/DECOMPRESSION MICRODISCECTOMY Left 04/13/2016   Procedure: Redo Microdiscectomy - left - Lumbar three--Lumbar four;  Surgeon: Maeola Harman, MD;  Location: [facility];  Service: Neurosurgery;  Laterality: Left;  . SHOULDER SURGERY Right   . TONSILLECTOMY    . VASECTOMY       Current Meds  Medication Sig  . amLODipine (NORVASC) 10 MG tablet Take 10 mg by mouth daily before breakfast.   . apixaban (ELIQUIS) 5 MG TABS tablet Take 5 mg by mouth 2 (two) times daily.   . benazepril (LOTENSIN) 20 MG tablet Take 20 mg by mouth daily.  . Cholecalciferol (VITAMIN D3) 5000 UNITS CAPS Take 5,000 Units by mouth every other day.  Marland Kitchen Dextromethorphan-Guaifenesin (CORICIDIN HBP CONGESTION/COUGH) 10-200 MG CAPS Take 1 capsule by mouth daily as needed (for cough and congestion).  Marland Kitchen docusate sodium (COLACE) 100 MG capsule Take 100 mg by mouth daily as needed for mild constipation.  . finasteride (PROSCAR) 5 MG tablet Take 1 tablet by mouth daily.  . furosemide (LASIX) 40 MG tablet Take 40 mg by mouth 2 (two) times a week.   Marland Kitchen GLIPIZIDE XL 10 MG 24 hr tablet Take 10 mg by mouth daily with breakfast.   . HYDROcodone-acetaminophen (NORCO) 10-325 MG tablet Take 1 tablet by mouth every 4 (four) hours as needed (pain).  . metoprolol succinate (TOPROL-XL) 100 MG 24 hr tablet Take 1 tablet (100 mg total) by mouth 2 (two) times daily.  . tamsulosin (FLOMAX) 0.4 MG CAPS capsule Take 1 capsule by mouth daily.      Family History: The patient's family history includes Aortic aneurysm in his brother; COPD in his brother; Diabetes in his brother and father; Heart failure in his  brother; Hepatitis in his father; Stroke in his mother.   ROS:   Please see the history of present illness.     All other systems reviewed and are negative.   Labs/Other Tests and Data Reviewed:     Recent Labs: No results found for requested labs within last 8760 hours.  Recent Lipid Panel No results found for: CHOL, TRIG, HDL, CHOLHDL, VLDL, LDLCALC, LDLDIRECT    Exam:    Vital Signs:  BP 122/76   Pulse 80   Wt 286 lb (129.7 kg)   BMI 38.79 kg/m     Wt Readings from Last 3 Encounters:  01/14/19 286 lb (129.7 kg)  07/18/18 295 lb 12.8 oz (134.2 kg)  04/13/16 299 lb (135.6 kg)     Well nourished, well developed male in no acute distress. Alert  awake is a 3 and 3.  Quite nice on the phone.  Denies having any swelling of lower extremities.  Diagnosis for this visit:   1. Chronic atrial fibrillation   2. Dilated cardiomyopathy (HCC)   3. Long term current use of anticoagulant therapy   4. Morbid obesity (HCC)      ASSESSMENT & PLAN:    1.  Permanent atrial fibrillation anticoagulated with Eliquis which I will continue we discussed the reason for the medication she perfectly understand.  Will continue.  Rate is controlled no palpitations. 2.  Dilated cardiomyopathy last echocardiogram showed ejection 50 to 55%.  We will continue present management. 3.  Long-term anticoagulation again we discussed the reason for it we discussed potential side effect.  He understands will continue taking the medication. 4.  Morbid obesity doing well from that point review however understand that losing weight would help he used to be walking somewhat now because of coronavirus situation he does not.  I encouraged him to still try to be active.  COVID-19 Education: The signs and symptoms of COVID-19 were discussed with the patient and how to seek care for testing (follow up with PCP or arrange E-visit).  The importance of social distancing was discussed today.  Patient Risk:   After  full review of this patients clinical status, I feel that they are at least moderate risk at this time.  Time:   Today, I have spent 16 minutes with the patient with telehealth technology discussing pt health issues. Visit was finished at 1:42 PM.    Medication Adjustments/Labs and Tests Ordered: Current medicines are reviewed at length with the patient today.  Concerns regarding medicines are outlined above.  No orders of the defined types were placed in this encounter.  Medication changes: No orders of the defined types were placed in this encounter.    Disposition: Follow-up in 6 months  Signed, Georgeanna Lea, MD, [facility] 01/14/2019 1:43 PM    [facility]

## 2019-03-19 ENCOUNTER — Other Ambulatory Visit: Payer: Self-pay | Admitting: Cardiology

## 2019-03-19 MED ORDER — AMLODIPINE BESYLATE 10 MG PO TABS: 10.0000 mg | ORAL_TABLET | Freq: Every day | ORAL | 1 refills | Status: DC

## 2019-03-19 NOTE — Telephone Encounter (Signed)
°*  STAT* If patient is at the pharmacy, call can be transferred to refill team.   1. Which medications need to be refilled? (please list name of each medication and dose if known) amLODipine (NORVASC) 10 MG   2. Which pharmacy/location (including street and city if local pharmacy) is medication to be sent to? [place], [place] - [address] [phone] (Phone) [phone] (Fax)   3. Do they need a 30 day or 90 day supply? 90 day

## 2019-06-15 ENCOUNTER — Other Ambulatory Visit: Payer: Self-pay | Admitting: Cardiology

## 2019-06-15 MED ORDER — APIXABAN 5 MG PO TABS: 5.0000 mg | ORAL_TABLET | Freq: Two times a day (BID) | ORAL | 3 refills | Status: DC

## 2019-06-15 NOTE — Addendum Note (Signed)
Addended by: Aleatha Borer on: 06/15/2019 03:16 PM   Modules accepted: Orders

## 2019-06-15 NOTE — Telephone Encounter (Signed)
Refill sent in

## 2019-06-15 NOTE — Telephone Encounter (Signed)
°*  STAT* If patient is at the pharmacy, call can be transferred to refill team.   1. Which medications need to be refilled? (please list name of each medication and dose if known) Eliquis 5mg  tablet  2. Which pharmacy/location (including street and city if local pharmacy) is medication to be sent to? [facility]  3. Do they need a 30 day or 90 day supply? [place]

## 2019-07-21 ENCOUNTER — Ambulatory Visit: Payer: Medicare Other | Admitting: Cardiology

## 2019-08-03 ENCOUNTER — Other Ambulatory Visit: Payer: Self-pay

## 2019-08-03 MED ORDER — AMLODIPINE BESYLATE 10 MG PO TABS: 10.0000 mg | ORAL_TABLET | Freq: Every day | ORAL | 1 refills | Status: DC

## 2019-08-06 ENCOUNTER — Other Ambulatory Visit: Payer: Self-pay | Admitting: Emergency Medicine

## 2019-08-06 MED ORDER — METOPROLOL SUCCINATE ER 100 MG PO TB24: 100.0000 mg | ORAL_TABLET | Freq: Two times a day (BID) | ORAL | 0 refills | Status: DC

## 2019-09-16 ENCOUNTER — Ambulatory Visit: Payer: Medicare Other | Admitting: Cardiology

## 2019-10-14 ENCOUNTER — Ambulatory Visit: Payer: Medicare Other | Admitting: Cardiology

## 2019-10-15 DIAGNOSIS — J1282 Pneumonia due to coronavirus disease 2019: Secondary | ICD-10-CM

## 2019-10-15 DIAGNOSIS — Z79899 Other long term (current) drug therapy: Secondary | ICD-10-CM

## 2019-10-15 DIAGNOSIS — B964 Proteus (mirabilis) (morganii) as the cause of diseases classified elsewhere: Secondary | ICD-10-CM

## 2019-10-15 DIAGNOSIS — I152 Hypertension secondary to endocrine disorders: Secondary | ICD-10-CM

## 2019-10-15 DIAGNOSIS — I4821 Permanent atrial fibrillation: Secondary | ICD-10-CM | POA: Insufficient documentation

## 2019-10-15 DIAGNOSIS — J9601 Acute respiratory failure with hypoxia: Secondary | ICD-10-CM

## 2019-10-15 DIAGNOSIS — E1159 Type 2 diabetes mellitus with other circulatory complications: Secondary | ICD-10-CM

## 2019-10-15 DIAGNOSIS — E119 Type 2 diabetes mellitus without complications: Secondary | ICD-10-CM | POA: Insufficient documentation

## 2019-10-15 DIAGNOSIS — U071 COVID-19: Secondary | ICD-10-CM

## 2019-10-15 DIAGNOSIS — N390 Urinary tract infection, site not specified: Secondary | ICD-10-CM

## 2019-10-15 DIAGNOSIS — N179 Acute kidney failure, unspecified: Secondary | ICD-10-CM

## 2019-10-15 HISTORY — DX: Permanent atrial fibrillation: I48.21

## 2019-10-15 HISTORY — DX: Hypertension secondary to endocrine disorders: I15.2

## 2019-10-15 HISTORY — DX: Type 2 diabetes mellitus without complications: E11.9

## 2019-10-15 HISTORY — DX: Other long term (current) drug therapy: Z79.899

## 2019-10-15 HISTORY — DX: COVID-19: U07.1

## 2019-10-15 HISTORY — DX: Pneumonia due to coronavirus disease 2019: J12.82

## 2019-10-15 HISTORY — DX: Acute kidney failure, unspecified: N17.9

## 2019-10-15 HISTORY — DX: Type 2 diabetes mellitus with other circulatory complications: E11.59

## 2019-10-15 HISTORY — DX: Urinary tract infection, site not specified: N39.0

## 2019-10-15 HISTORY — DX: Morbid (severe) obesity due to excess calories: E66.01

## 2019-10-15 HISTORY — DX: Acute respiratory failure with hypoxia: J96.01

## 2019-10-15 HISTORY — DX: Proteus (mirabilis) (morganii) as the cause of diseases classified elsewhere: B96.4

## 2019-12-11 ENCOUNTER — Other Ambulatory Visit: Payer: Self-pay | Admitting: Cardiology

## 2020-01-11 ENCOUNTER — Other Ambulatory Visit: Payer: Self-pay

## 2020-01-12 ENCOUNTER — Encounter: Payer: Self-pay | Admitting: Cardiology

## 2020-01-12 ENCOUNTER — Ambulatory Visit (INDEPENDENT_AMBULATORY_CARE_PROVIDER_SITE_OTHER): Payer: Medicare Other | Admitting: Cardiology

## 2020-01-12 ENCOUNTER — Other Ambulatory Visit: Payer: Self-pay

## 2020-01-12 VITALS — BP 146/82 | HR 76 | Temp 97.9°F | Ht 72.0 in | Wt 285.4 lb

## 2020-01-12 DIAGNOSIS — I10 Essential (primary) hypertension: Secondary | ICD-10-CM | POA: Diagnosis not present

## 2020-01-12 DIAGNOSIS — I4821 Permanent atrial fibrillation: Secondary | ICD-10-CM | POA: Diagnosis not present

## 2020-01-12 DIAGNOSIS — I420 Dilated cardiomyopathy: Secondary | ICD-10-CM

## 2020-01-12 NOTE — Progress Notes (Signed)
Cardiology Office Note:    Date:  01/12/2020   ID:  Devin Rios, DOB November 19, 1940, MRN [id number]  PCP:  Dan Maker, MD  Cardiologist:  Gypsy Balsam, MD    Referring MD: Dan Maker, MD   No chief complaint on file. IM short of breath  History of Present Illness:    Devin Rios is a 79 y.o. male with past medical history significant for permanent atrial fibrillation, anticoagulated with Eliquis, history of cardiomyopathy with latest normalization of left ventricle ejection fraction, diabetes, essential hypertension, dyslipidemia.  He comes today to my office for follow-up.  Overall he is doing better right now.  In January he got sick with COVID-19 developed pneumonia, required hospitalization for 5 days but lately seems to be recovering after that still however complain of having shortness of breath fatigue and tiredness.  Denies having a palpitations no swelling of lower extremities no dizziness no passing out  Past Medical History:  Diagnosis Date  . Acute hypoxemic respiratory failure due to COVID-19 [facility]) 10/15/2019   Last Assessment & Plan:  Formatting of this note might be different from the original. Patient with acute hypoxic respiratory failure with O2 sat down to 76% on initial ER vital signs.  He did improve with supplemental oxygen but required high-flow nasal cannula initially.  Encourage proning as tolerated-which he has not been doing as he says it is uncomfortable.  Encourage the patient to continue  . Acute respiratory failure with hypoxia (HCC) 10/15/2019   Last Assessment & Plan:  Formatting of this note might be different from the original. Secondary to multifocal pneumonia with COVID infection. Patient has been weaned from 6 L nasal cannula to 4 L. continues to desat when he takes his oxygen level off into the lower 80s.  Reinforced with the patient as well as with his wife on a separate conversation the importance of keeping his oxygen in at all     . Arthritis    chronic back, knee, shoulder pain  . Atrial fibrillation (HCC)   . Atrial fibrillation with RVR (HCC) 09/15/2013  . Cardiomyopathy (HCC)   . Cataract, immature    right eye  . CHF (congestive heart failure), NYHA class III, acute, diastolic (HCC) 05/22/2016  . Chronic anticoagulation 11/13/2018  . Chronic kidney disease    Stage 3  . Chronic prescription opiate use 11/13/2018  . Chronic venous insufficiency   . CKD stage 3 due to type 2 diabetes mellitus (HCC) 08/08/2015  . Depression    due to limited mobility related to back problems  . Diabetes mellitus   . Diabetes mellitus (HCC) 05/09/2011  . Dilated cardiomyopathy (HCC) 07/18/2018   Ejection fraction 4045% based on echocardiogram from 2017  . DM (diabetes mellitus), type 2 with renal complications (HCC) 10/07/2014  . Dysrhythmia    afib- sees Dr. Donnie Aho, on Eliquis, will be  holding for surgery 05/2014  . Dysuria 12/27/2015  . Embolism - blood clot 05/09/2011   S/p knee surgery   . Herniation of lumbar intervertebral disc 02/02/2014  . History of DVT (deep vein thrombosis)   . Hypertension    stress test done, early 2015, wnl  . Hypertension associated with type 2 diabetes mellitus (HCC) 10/15/2019   Last Assessment & Plan:  Formatting of this note might be different from the original. Blood pressures been intermittently elevated.  Would recommend that the patient resume his home medications for discharge and add low-dose lisinopril but would follow up with  his PCP after hospitalization in 3 days for repeat blood pressure check, weaning oxygen, and adjustment of blood pressure medications as n  . Hypertensive heart disease   . Hypertensive heart disease without CHF 07/14/2018  . Hyponatremia 05/17/2016  . Kidney stones   . Leukocytosis 05/17/2016  . Long term current use of anticoagulant therapy 07/14/2018  . Long-term use of high-risk medication 10/15/2019   Last Assessment & Plan:  Formatting of this note might be  different from the original. Patient is chronically on Eliquis for chronic AFib.  Continue Eliquis b.i.d. for discharge.  . Low back pain 10/21/2013  . Lumbar disc disease 03/10/2014  . Metatarsalgia of both feet 02/21/2016  . Morbid (severe) obesity due to excess calories (HCC) 11/13/2018  . Morbid obesity (HCC) 07/14/2018  . Neuromuscular disorder (HCC)    lumbar HNP & stenosis   . Nonobstructive cardiomyopathy (HCC)   . Onychomycosis due to dermatophyte 02/21/2016  . Other acquired hammer toe 02/21/2016  . Peripheral vascular disease (HCC)    blood clot in leg- after knees arthroscope- R leg   . Permanent atrial fibrillation (HCC) 10/15/2019   Last Assessment & Plan:  Formatting of this note might be different from the original. Rate is currently controlled.  Continue metoprolol. Continue Eliquis.  . Pneumonia due to COVID-19 virus 10/15/2019   Last Assessment & Plan:  Formatting of this note might be different from the original. Patient was tested on 01/05 at his PCP office.  His positive result came back on 01/08.  Patient completed a 5 day course of Remdesivir.  He will also be continued on melatonin, vitamin C, and zinc for discharge.  Encourage the patient to continue incentive spirometry and Acapella for discharge.  Vitamin-D level  . Shortness of breath    pt. attributes to afib  . Sleep apnea 8/12     pt denies; no cpap;dx by Dr. Shelle Iron  . Sleep apnea   . Type 2 diabetes mellitus without complication, without long-term current use of insulin (HCC) 10/15/2019   Last Assessment & Plan:  Formatting of this note might be different from the original. Patient's hemoglobin A1c is 6.4. Patient continues to have some hyperglycemia likely steroid induced.  Continue glipizide 5 mg b.i.d.Marland Kitchen    Blood sugars have ranged from a 158-306 in last 24 hours.  Resume home medication regimen for discharge.  Hyperglycemia should improve as patient discontinues steroids in 5 da  . Urethral stricture     Past  Surgical History:  Procedure Laterality Date  . BACK SURGERY    . COLONOSCOPY     x 2  . CYSTOSCOPY WITH URETHRAL DILATATION  10/28/2012   Procedure: CYSTOSCOPY WITH URETHRAL DILATATION;  Surgeon: Antony Haste, MD;  Location: [facility];  Service: Urology;  Laterality: N/A;  visual dilatation  . HEMORROIDECTOMY    . KNEE SURGERY Right   . LUMBAR LAMINECTOMY/DECOMPRESSION MICRODISCECTOMY Left 05/28/2014   Procedure: Left Lumbar Three-Four Microdiskectomy;  Surgeon: Maeola Harman, MD;  Location: [facility];  Service: Neurosurgery;  Laterality: Left;  Left L3-4 Microdiskectomy  . LUMBAR LAMINECTOMY/DECOMPRESSION MICRODISCECTOMY Left 04/13/2016   Procedure: Redo Microdiscectomy - left - Lumbar three--Lumbar four;  Surgeon: Maeola Harman, MD;  Location: [facility];  Service: Neurosurgery;  Laterality: Left;  . SHOULDER SURGERY Right   . TONSILLECTOMY    . VASECTOMY      Current Medications: Current Meds  Medication Sig  . amLODipine (NORVASC) 10 MG tablet Take 1 tablet (  10 mg total) by mouth daily before breakfast.  . apixaban (ELIQUIS) 5 MG TABS tablet Take 1 tablet (5 mg total) by mouth 2 (two) times daily.  . benazepril (LOTENSIN) 20 MG tablet Take 20 mg by mouth daily.  . Cholecalciferol 125 MCG/ML LIQD Take 2 capsules by mouth daily.  Marland Kitchen Dextromethorphan-Guaifenesin (CORICIDIN HBP CONGESTION/COUGH) 10-200 MG CAPS Take 1 capsule by mouth daily as needed (for cough and congestion).  . finasteride (PROSCAR) 5 MG tablet Take 1 tablet by mouth daily.  . furosemide (LASIX) 40 MG tablet Take 40 mg by mouth 2 (two) times a week.   Marland Kitchen glipiZIDE (GLUCOTROL XL) 10 MG 24 hr tablet Take 1 tablet by mouth daily.  Marland Kitchen HYDROcodone-acetaminophen (NORCO) 10-325 MG tablet Take 1 tablet by mouth every 8 (eight) hours as needed for pain.  . metoprolol succinate (TOPROL-XL) 100 MG 24 hr tablet Take 1 tablet (100 mg total) by mouth 2 (two) times daily. MUST BE SEEN IN CLINIC FOR FURTHER  REFILLS  . ofloxacin (OCUFLOX) 0.3 % ophthalmic solution Place 1 drop into both eyes daily.  . tamsulosin (FLOMAX) 0.4 MG CAPS capsule Take 0.4 mg by mouth at bedtime.     Allergies:   Tizanidine and Codeine   Social History   Socioeconomic History  . Marital status: Married    Spouse name: Not on file  . Number of children: Not on file  . Years of education: Not on file  . Highest education level: Not on file  Occupational History  . 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 Smoking status: Former Smoker    Packs/day: 2.50    Years: 17.00    Pack years: 42.50    Types: Cigarettes    Quit date: 10/02/1971    Years since quitting: 48.3  . Smokeless tobacco: Never Used  Substance and Sexual Activity  . Alcohol use: No    Comment: none since 1977, heavy drinker prior  . Drug use: No  . Sexual activity: Not on file  Other Topics Concern  . Not on file  Social History Narrative  . Not on file   Social Determinants of Health   Financial Resource Strain:   . Difficulty of Paying Living Expenses:   Food Insecurity:   . Worried About Charity fundraiser in the Last Year:   . Arboriculturist in the Last Year:   Transportation Needs:   . Film/video editor (Medical):   Marland Kitchen Lack of Transportation (Non-Medical):   Physical Activity:   . Days of Exercise per Week:   . Minutes of Exercise per Session:   Stress:   . Feeling of Stress :   Social Connections:   . Frequency of Communication with Friends and Family:   . Frequency of Social Gatherings with Friends and Family:   . Attends Religious Services:   . Active Member of Clubs or Organizations:   . Attends Archivist Meetings:   Marland Kitchen Marital Status:      Family History: The patient's family history includes Aortic aneurysm in his brother; COPD in his brother; Diabetes in his brother and father; Heart failure in his brother; Hepatitis in his  father; Stroke in his mother. ROS:   Please see the history of present illness.    All 14 point review of systems negative except as described per history of present illness  EKGs/Labs/Other Studies Reviewed:  Recent Labs: No results found for requested labs within last 8760 hours.  Recent Lipid Panel No results found for: CHOL, TRIG, HDL, CHOLHDL, VLDL, LDLCALC, LDLDIRECT  Physical Exam:    VS:  BP (!) 146/82   Pulse 76   Temp 97.9 F (36.6 C)   Ht 6' (1.829 m)   Wt 285 lb 6.4 oz (129.5 kg)   SpO2 96%   BMI 38.71 kg/m     Wt Readings from Last 3 Encounters:  01/12/20 285 lb 6.4 oz (129.5 kg)  01/14/19 286 lb (129.7 kg)  07/18/18 295 lb 12.8 oz (134.2 kg)     GEN:  Well nourished, well developed in no acute distress HEENT: Normal NECK: No JVD; No carotid bruits LYMPHATICS: No lymphadenopathy CARDIAC: RRR, no murmurs, no rubs, no gallops RESPIRATORY:  Clear to auscultation without rales, wheezing or rhonchi  ABDOMEN: Soft, non-tender, non-distended MUSCULOSKELETAL:  No edema; No deformity  SKIN: Warm and dry LOWER EXTREMITIES: no swelling NEUROLOGIC:  Alert and oriented x 3 PSYCHIATRIC:  Normal affect   ASSESSMENT:    1. Permanent atrial fibrillation (HCC)   2. Essential hypertension   3. Dilated cardiomyopathy (HCC)   4. Morbid obesity (HCC)    PLAN:    In order of problems listed above:  1. Permanent atrial fibrillation rate controlled he is anticoagulated Eliquis which I will continue, AV blockade has been achieved with metoprolol succinate 100 mg twice daily. 2. Essential hypertension blood pressure slightly on the higher side.  We will continue monitoring. 3. History of cardiomyopathy with normalization.  I will ask him to have repeat echocardiogram to reassess left ventricle ejection fraction.  In the meantime we will continue Lotensin as well as Toprol-XL. 4. Morbid obesity obviously a problem.  I suspect is trying to be more  active. 5. Dyslipidemia.  I do not have his fasting lipid profile I did review K PN and there is no data.  Therefore, I will ask him to send a copy of his report from his primary care physician he tells me always his cholesterol is very good.  If I will not get a copy of it well do the test.  I will also contact his primary care physician to get help.   Medication Adjustments/Labs and Tests Ordered: Current medicines are reviewed at length with the patient today.  Concerns regarding medicines are outlined above.  No orders of the defined types were placed in this encounter.  Medication changes: No orders of the defined types were placed in this encounter.   Signed, Devin Lea, MD, [facility] 01/12/2020 2:37 PM    [facility]

## 2020-01-12 NOTE — Patient Instructions (Signed)
Medication Instructions:  °Your physician recommends that you continue on your current medications as directed. Please refer to the Current Medication list given to you today. ° °*If you need a refill on your cardiac medications before your next appointment, please call your pharmacy* ° ° °Lab Work: °None. ° °If you have labs (blood work) drawn today and your tests are completely normal, you will receive your results only by: °• MyChart Message (if you have MyChart) OR °• A paper copy in the mail °If you have any lab test that is abnormal or we need to change your treatment, we will call you to review the results. ° ° °Testing/Procedures: °Your physician has requested that you have an echocardiogram. Echocardiography is a painless test that uses sound waves to create images of your heart. It provides your doctor with information about the size and shape of your heart and how well your heart’s chambers and valves are working. This procedure takes approximately one hour. There are no restrictions for this procedure. ° ° ° ° °Follow-Up: °At [facility], you and your health needs are our priority.  As part of our continuing mission to provide you with exceptional heart care, we have created designated Provider Care Teams.  These Care Teams include your primary Cardiologist (physician) and Advanced Practice Providers (APPs -  Physician Assistants and Nurse Practitioners) who all work together to provide you with the care you need, when you need it. ° °We recommend signing up for the patient portal called "MyChart".  Sign up information is provided on this After Visit Summary.  MyChart is used to connect with patients for Virtual Visits (Telemedicine).  Patients are able to view lab/test results, encounter notes, upcoming appointments, etc.  Non-urgent messages can be sent to your provider as well.   °To learn more about what you can do with MyChart, go to [email or web address].   ° °Your next appointment:   °6  month(s) ° °The format for your next appointment:   °In Person ° °Provider:   °Robert Krasowski, MD ° ° °Other Instructions ° ° °Echocardiogram °An echocardiogram is a procedure that uses painless sound waves (ultrasound) to produce an image of the heart. Images from an echocardiogram can provide important information about: °· Signs of coronary artery disease (CAD). °· Aneurysm detection. An aneurysm is a weak or damaged part of an artery wall that bulges out from the normal force of blood pumping through the body. °· Heart size and shape. Changes in the size or shape of the heart can be associated with certain conditions, including heart failure, aneurysm, and CAD. °· Heart muscle function. °· Heart valve function. °· Signs of a past heart attack. °· Fluid buildup around the heart. °· Thickening of the heart muscle. °· A tumor or infectious growth around the heart valves. °Tell a health care provider about: °· Any allergies you have. °· All medicines you are taking, including vitamins, herbs, eye drops, creams, and over-the-counter medicines. °· Any blood disorders you have. °· Any surgeries you have had. °· Any medical conditions you have. °· Whether you are pregnant or may be pregnant. °What are the risks? °Generally, this is a safe procedure. However, problems may occur, including: °· Allergic reaction to dye (contrast) that may be used during the procedure. °What happens before the procedure? °No specific preparation is needed. You may eat and drink normally. °What happens during the procedure? ° °· An IV tube may be inserted into one of your veins. °· You may   receive contrast through this tube. A contrast is an injection that improves the quality of the pictures from your heart. °· A gel will be applied to your chest. °· A wand-like tool (transducer) will be moved over your chest. The gel will help to transmit the sound waves from the transducer. °· The sound waves will harmlessly bounce off of your heart to  allow the heart images to be captured in real-time motion. The images will be recorded on a computer. °The procedure may vary among health care providers and hospitals. °What happens after the procedure? °· You may return to your normal, everyday life, including diet, activities, and medicines, unless your health care provider tells you not to do that. °Summary °· An echocardiogram is a procedure that uses painless sound waves (ultrasound) to produce an image of the heart. °· Images from an echocardiogram can provide important information about the size and shape of your heart, heart muscle function, heart valve function, and fluid buildup around your heart. °· You do not need to do anything to prepare before this procedure. You may eat and drink normally. °· After the echocardiogram is completed, you may return to your normal, everyday life, unless your health care provider tells you not to do that. °This information is not intended to replace advice given to you by your health care provider. Make sure you discuss any questions you have with your health care provider. °Document Revised: 01/08/2019 Document Reviewed: 10/20/2016 °Elsevier Patient Education © 2020 Elsevier Inc. ° ° °

## 2020-01-15 ENCOUNTER — Other Ambulatory Visit: Payer: Self-pay | Admitting: Cardiology

## 2020-01-15 MED ORDER — APIXABAN 5 MG PO TABS: 5.0000 mg | ORAL_TABLET | Freq: Two times a day (BID) | ORAL | 1 refills | Status: DC

## 2020-01-15 NOTE — Telephone Encounter (Signed)
°*  STAT* If patient is at the pharmacy, call can be transferred to refill team.   1. Which medications need to be refilled? (please list name of each medication and dose if known) apixaban (ELIQUIS) 5 MG TABS tablet  2. Which pharmacy/location (including street and city if local pharmacy) is medication to be sent to? [facility] - [place] - [address]  3. Do they need a 30 day or 90 day supply? 30 day  Patient is out of medication

## 2020-01-28 ENCOUNTER — Other Ambulatory Visit: Payer: Self-pay

## 2020-01-28 ENCOUNTER — Ambulatory Visit (INDEPENDENT_AMBULATORY_CARE_PROVIDER_SITE_OTHER): Payer: Medicare Other

## 2020-01-28 DIAGNOSIS — I10 Essential (primary) hypertension: Secondary | ICD-10-CM

## 2020-01-28 DIAGNOSIS — I420 Dilated cardiomyopathy: Secondary | ICD-10-CM | POA: Diagnosis not present

## 2020-01-28 DIAGNOSIS — I4821 Permanent atrial fibrillation: Secondary | ICD-10-CM | POA: Diagnosis not present

## 2020-01-28 MED ORDER — PERFLUTREN LIPID MICROSPHERE: 1.0000 mL | INTRAVENOUS | 0 refills | Status: DC | PRN

## 2020-01-28 NOTE — Progress Notes (Signed)
2D Echocardiogram has been performed. Emmaleah Meroney RDCS 

## 2020-02-03 ENCOUNTER — Other Ambulatory Visit: Payer: Self-pay | Admitting: *Deleted

## 2020-02-03 MED ORDER — METOPROLOL SUCCINATE ER 100 MG PO TB24: 100.0000 mg | ORAL_TABLET | Freq: Two times a day (BID) | ORAL | 5 refills | Status: DC

## 2020-02-03 NOTE — Telephone Encounter (Signed)
Received fax request for refill on Metoprolol Succinate ER 100 mg from pharmacy. Rx sent to pharmacy per request for # 60 and 5 refills

## 2020-03-24 ENCOUNTER — Other Ambulatory Visit: Payer: Self-pay | Admitting: Cardiology

## 2020-09-06 ENCOUNTER — Other Ambulatory Visit: Payer: Self-pay

## 2020-09-06 DIAGNOSIS — G709 Myoneural disorder, unspecified: Secondary | ICD-10-CM | POA: Insufficient documentation

## 2020-09-06 DIAGNOSIS — I499 Cardiac arrhythmia, unspecified: Secondary | ICD-10-CM | POA: Insufficient documentation

## 2020-09-06 DIAGNOSIS — H269 Unspecified cataract: Secondary | ICD-10-CM | POA: Insufficient documentation

## 2020-09-06 DIAGNOSIS — I739 Peripheral vascular disease, unspecified: Secondary | ICD-10-CM | POA: Insufficient documentation

## 2020-09-06 DIAGNOSIS — I428 Other cardiomyopathies: Secondary | ICD-10-CM | POA: Insufficient documentation

## 2020-09-06 DIAGNOSIS — F32A Depression, unspecified: Secondary | ICD-10-CM | POA: Insufficient documentation

## 2020-09-06 DIAGNOSIS — M1990 Unspecified osteoarthritis, unspecified site: Secondary | ICD-10-CM | POA: Insufficient documentation

## 2020-09-06 DIAGNOSIS — R0602 Shortness of breath: Secondary | ICD-10-CM | POA: Insufficient documentation

## 2020-09-06 DIAGNOSIS — Z86718 Personal history of other venous thrombosis and embolism: Secondary | ICD-10-CM | POA: Insufficient documentation

## 2020-09-06 DIAGNOSIS — I429 Cardiomyopathy, unspecified: Secondary | ICD-10-CM | POA: Insufficient documentation

## 2020-09-06 DIAGNOSIS — N35919 Unspecified urethral stricture, male, unspecified site: Secondary | ICD-10-CM | POA: Insufficient documentation

## 2020-09-06 DIAGNOSIS — N189 Chronic kidney disease, unspecified: Secondary | ICD-10-CM | POA: Insufficient documentation

## 2020-09-06 DIAGNOSIS — I4891 Unspecified atrial fibrillation: Secondary | ICD-10-CM | POA: Insufficient documentation

## 2020-09-06 DIAGNOSIS — N200 Calculus of kidney: Secondary | ICD-10-CM | POA: Insufficient documentation

## 2020-09-07 ENCOUNTER — Encounter: Payer: Self-pay | Admitting: Cardiology

## 2020-09-07 ENCOUNTER — Ambulatory Visit (INDEPENDENT_AMBULATORY_CARE_PROVIDER_SITE_OTHER): Payer: Medicare Other | Admitting: Cardiology

## 2020-09-07 ENCOUNTER — Other Ambulatory Visit: Payer: Self-pay

## 2020-09-07 VITALS — BP 118/76 | HR 73 | Ht 72.0 in | Wt 295.0 lb

## 2020-09-07 DIAGNOSIS — I5031 Acute diastolic (congestive) heart failure: Secondary | ICD-10-CM

## 2020-09-07 DIAGNOSIS — I420 Dilated cardiomyopathy: Secondary | ICD-10-CM

## 2020-09-07 DIAGNOSIS — I4821 Permanent atrial fibrillation: Secondary | ICD-10-CM | POA: Diagnosis not present

## 2020-09-07 NOTE — Patient Instructions (Signed)
Medication Instructions:  Your physician recommends that you continue on your current medications as directed. Please refer to the Current Medication list given to you today.  *If you need a refill on your cardiac medications before your next appointment, please call your pharmacy*   Lab Work: None. If you have labs (blood work) drawn today and your tests are completely normal, you will receive your results only by: . MyChart Message (if you have MyChart) OR . A paper copy in the mail If you have any lab test that is abnormal or we need to change your treatment, we will call you to review the results.   Testing/Procedures: Your physician has requested that you have an echocardiogram. Echocardiography is a painless test that uses sound waves to create images of your heart. It provides your doctor with information about the size and shape of your heart and how well your heart's chambers and valves are working. This procedure takes approximately one hour. There are no restrictions for this procedure.     Follow-Up: At [facility], you and your health needs are our priority.  As part of our continuing mission to provide you with exceptional heart care, we have created designated Provider Care Teams.  These Care Teams include your primary Cardiologist (physician) and Advanced Practice Providers (APPs -  Physician Assistants and Nurse Practitioners) who all work together to provide you with the care you need, when you need it.  We recommend signing up for the patient portal called "MyChart".  Sign up information is provided on this After Visit Summary.  MyChart is used to connect with patients for Virtual Visits (Telemedicine).  Patients are able to view lab/test results, encounter notes, upcoming appointments, etc.  Non-urgent messages can be sent to your provider as well.   To learn more about what you can do with MyChart, go to [email or web address].    Your next appointment:   5  month(s)  The format for your next appointment:   In Person  Provider:   Robert Krasowski, MD   Other Instructions   Echocardiogram An echocardiogram is a procedure that uses painless sound waves (ultrasound) to produce an image of the heart. Images from an echocardiogram can provide important information about:  Signs of coronary artery disease (CAD).  Aneurysm detection. An aneurysm is a weak or damaged part of an artery wall that bulges out from the normal force of blood pumping through the body.  Heart size and shape. Changes in the size or shape of the heart can be associated with certain conditions, including heart failure, aneurysm, and CAD.  Heart muscle function.  Heart valve function.  Signs of a past heart attack.  Fluid buildup around the heart.  Thickening of the heart muscle.  A tumor or infectious growth around the heart valves. Tell a health care provider about:  Any allergies you have.  All medicines you are taking, including vitamins, herbs, eye drops, creams, and over-the-counter medicines.  Any blood disorders you have.  Any surgeries you have had.  Any medical conditions you have.  Whether you are pregnant or may be pregnant. What are the risks? Generally, this is a safe procedure. However, problems may occur, including:  Allergic reaction to dye (contrast) that may be used during the procedure. What happens before the procedure? No specific preparation is needed. You may eat and drink normally. What happens during the procedure?   An IV tube may be inserted into one of your veins.  You may receive   contrast through this tube. A contrast is an injection that improves the quality of the pictures from your heart.  A gel will be applied to your chest.  A wand-like tool (transducer) will be moved over your chest. The gel will help to transmit the sound waves from the transducer.  The sound waves will harmlessly bounce off of your heart to  allow the heart images to be captured in real-time motion. The images will be recorded on a computer. The procedure may vary among health care providers and hospitals. What happens after the procedure?  You may return to your normal, everyday life, including diet, activities, and medicines, unless your health care provider tells you not to do that. Summary  An echocardiogram is a procedure that uses painless sound waves (ultrasound) to produce an image of the heart.  Images from an echocardiogram can provide important information about the size and shape of your heart, heart muscle function, heart valve function, and fluid buildup around your heart.  You do not need to do anything to prepare before this procedure. You may eat and drink normally.  After the echocardiogram is completed, you may return to your normal, everyday life, unless your health care provider tells you not to do that. This information is not intended to replace advice given to you by your health care provider. Make sure you discuss any questions you have with your health care provider. Document Revised: 01/08/2019 Document Reviewed: 10/20/2016 Elsevier Patient Education  2020 Elsevier Inc.   

## 2020-09-07 NOTE — Progress Notes (Signed)
Cardiology Office Note:    Date:  09/07/2020   ID:  Devin Rios, DOB 12/02/1940, MRN [id number]  PCP:  Dan Maker, MD  Cardiologist:  Gypsy Balsam, MD    Referring MD: Dan Maker, MD   Chief Complaint  Patient presents with  . Follow-up  I am doing fine  History of Present Illness:    Devin Rios is a 79 y.o. male with past medical history significant for cardiomyopathy normalization of ejection fraction have a last echocardiogram done in April of this year showed ejection fraction 45%.  Actually his ejection fraction 2017 was 40 to 45% exactly the same that we see now.  Also history of permanent atrial fibrillation, anticoagulated, diabetes mellitus, essential hypertension, dyslipidemia.  At the beginning of year he did suffer from COVID-19 infection when I seen him in April he was still struggling with this but now seems to be doing better. He comes today to my office for follow-up. Described to have some exertional shortness of breath but nothing extraordinary, no palpitations no swelling of lower extremities. He sleeps well the only time he have to wake up in the middle of the night is because of need to urinate. No proximal nocturnal dyspnea.  Past Medical History:  Diagnosis Date  . Acute hypoxemic respiratory failure due to COVID-19 [facility]) 10/15/2019   Last Assessment & Plan:  Formatting of this note might be different from the original. Patient with acute hypoxic respiratory failure with O2 sat down to 76% on initial ER vital signs.  He did improve with supplemental oxygen but required high-flow nasal cannula initially.  Encourage proning as tolerated-which he has not been doing as he says it is uncomfortable.  Encourage the patient to continue  . Acute kidney injury (HCC) 10/15/2019   Last Assessment & Plan:  Formatting of this note might be different from the original. Patient's creatinine 2.2 on initial ED labs.  Creatinine has trended down to 1.4.   Continue to follow with PCP to ensure that this is his baseline.  . Acute on chronic kidney failure (HCC) 05/17/2016  . Acute respiratory failure with hypoxia (HCC) 10/15/2019   Last Assessment & Plan:  Formatting of this note might be different from the original. Secondary to multifocal pneumonia with COVID infection. Patient has been weaned from 6 L nasal cannula to 4 L. continues to desat when he takes his oxygen level off into the lower 80s.  Reinforced with the patient as well as with his wife on a separate conversation the importance of keeping his oxygen in at all   . Arthritis    chronic back, knee, shoulder pain  . Atrial fibrillation (HCC)   . Atrial fibrillation with RVR (HCC) 09/15/2013  . Benign prostatic hyperplasia without lower urinary tract symptoms 11/13/2018  . Cardiomyopathy (HCC)   . Cataract, immature    right eye  . CHF (congestive heart failure), NYHA class III, acute, diastolic (HCC) 05/22/2016  . Chronic anticoagulation 11/13/2018  . Chronic atrial fibrillation (HCC) 07/14/2018   CHA2DS2VASC score 3   . Chronic kidney disease    Stage 3  . Chronic prescription opiate use 11/13/2018  . Chronic prostatitis 05/17/2016  . Chronic venous insufficiency   . CKD (chronic kidney disease), stage III (HCC) 09/15/2013  . CKD stage 3 due to type 2 diabetes mellitus (HCC) 08/08/2015  . Depression    due to limited mobility related to back problems  . Diabetes mellitus   . Diabetes mellitus (  HCC) 05/09/2011  . Dilated cardiomyopathy (HCC) 07/18/2018   Ejection fraction 4045% based on echocardiogram from 2017  . DM (diabetes mellitus), type 2 with renal complications (HCC) 10/07/2014  . Dysrhythmia    afib- sees Dr. Donnie Aho, on Eliquis, will be  holding for surgery 05/2014  . Dysuria 12/27/2015  . Embolism - blood clot 05/09/2011   S/p knee surgery   . Herniation of lumbar intervertebral disc 02/02/2014  . History of DVT (deep vein thrombosis)   . Hypertension    stress test done, early  2015, wnl  . Hypertension associated with type 2 diabetes mellitus (HCC) 10/15/2019   Last Assessment & Plan:  Formatting of this note might be different from the original. Blood pressures been intermittently elevated.  Would recommend that the patient resume his home medications for discharge and add low-dose lisinopril but would follow up with his PCP after hospitalization in 3 days for repeat blood pressure check, weaning oxygen, and adjustment of blood pressure medications as n  . Hypertensive heart disease   . Hypertensive heart disease without CHF 07/14/2018  . Hyponatremia 05/17/2016  . Kidney disease, chronic, stage III (GFR 30-59 ml/min) (HCC) 07/14/2018  . Kidney stones   . Leukocytosis 05/17/2016  . Long term current use of anticoagulant therapy 07/14/2018  . Long-term use of high-risk medication 10/15/2019   Last Assessment & Plan:  Formatting of this note might be different from the original. Patient is chronically on Eliquis for chronic AFib.  Continue Eliquis b.i.d. for discharge.  . Low back pain 10/21/2013  . Lumbar disc disease 03/10/2014  . Metatarsalgia of both feet 02/21/2016  . Morbid (severe) obesity due to excess calories (HCC) 11/13/2018  . Morbid obesity (HCC) 07/14/2018  . Morbid obesity due to excess calories (HCC) 10/15/2019   Last Assessment & Plan:  Formatting of this note might be different from the original. BMI 37.80.  Would recommend reduced calorie diet and increasing daily activity for weight loss after discharge.  . Morbid obesity with BMI of 40.0-44.9, adult (HCC) 01/11/2017  . Neuromuscular disorder (HCC)    lumbar HNP & stenosis   . Nonobstructive cardiomyopathy (HCC)   . Onychomycosis due to dermatophyte 02/21/2016  . Other acquired hammer toe 02/21/2016  . Peripheral vascular disease (HCC)    blood clot in leg- after knees arthroscope- R leg   . Permanent atrial fibrillation (HCC) 10/15/2019   Last Assessment & Plan:  Formatting of this note might be  different from the original. Rate is currently controlled.  Continue metoprolol. Continue Eliquis.  . Pneumonia due to COVID-19 virus 10/15/2019   Last Assessment & Plan:  Formatting of this note might be different from the original. Patient was tested on 01/05 at his PCP office.  His positive result came back on 01/08.  Patient completed a 5 day course of Remdesivir.  He will also be continued on melatonin, vitamin C, and zinc for discharge.  Encourage the patient to continue incentive spirometry and Acapella for discharge.  Vitamin-D level  . Primary insomnia 03/14/2015  . Shortness of breath    pt. attributes to afib  . Sleep apnea 8/12     pt denies; no cpap;dx by Dr. Shelle Iron  . Sleep apnea   . Type 2 diabetes mellitus without complication, without long-term current use of insulin (HCC) 10/15/2019   Last Assessment & Plan:  Formatting of this note might be different from the original. Patient's hemoglobin A1c is 6.4. Patient continues to have some hyperglycemia likely  steroid induced.  Continue glipizide 5 mg b.i.d.Marland Kitchen    Blood sugars have ranged from a 158-306 in last 24 hours.  Resume home medication regimen for discharge.  Hyperglycemia should improve as patient discontinues steroids in 5 da  . Urethral stricture   . Urinary tract infection due to Proteus 10/15/2019   Last Assessment & Plan:  Formatting of this note might be different from the original. Patient had evidence on urinalysis of UTI with positive leukocytes, nitrite, evidence of WBCs as well as moderate amount of bacteria.  Patient was given Rocephin in the ED.  He was continued on Rocephin and has completed a 5 day course which should be sufficient for complete treatment.  Urine culture with growth  . Vitamin D deficiency 10/07/2014    Past Surgical History:  Procedure Laterality Date  . BACK SURGERY    . COLONOSCOPY     x 2  . CYSTOSCOPY WITH URETHRAL DILATATION  10/28/2012   Procedure: CYSTOSCOPY WITH URETHRAL DILATATION;  Surgeon:  Antony Haste, MD;  Location: [facility];  Service: Urology;  Laterality: N/A;  visual dilatation  . HEMORROIDECTOMY    . KNEE SURGERY Right   . LUMBAR LAMINECTOMY/DECOMPRESSION MICRODISCECTOMY Left 05/28/2014   Procedure: Left Lumbar Three-Four Microdiskectomy;  Surgeon: Maeola Harman, MD;  Location: [facility];  Service: Neurosurgery;  Laterality: Left;  Left L3-4 Microdiskectomy  . LUMBAR LAMINECTOMY/DECOMPRESSION MICRODISCECTOMY Left 04/13/2016   Procedure: Redo Microdiscectomy - left - Lumbar three--Lumbar four;  Surgeon: Maeola Harman, MD;  Location: [facility];  Service: Neurosurgery;  Laterality: Left;  . SHOULDER SURGERY Right   . TONSILLECTOMY    . VASECTOMY      Current Medications: Current Meds  Medication Sig  . apixaban (ELIQUIS) 5 MG TABS tablet Take 1 tablet (5 mg total) by mouth 2 (two) times daily.  . Cholecalciferol 125 MCG/ML LIQD Take 2 capsules by mouth daily.  . ferrous sulfate 325 (65 FE) MG tablet Take 325 mg by mouth daily with breakfast.  . finasteride (PROSCAR) 5 MG tablet Take 1 tablet by mouth daily.  . furosemide (LASIX) 40 MG tablet Take 40 mg by mouth 2 (two) times a week.   Marland Kitchen glipiZIDE (GLUCOTROL XL) 10 MG 24 hr tablet Take 1 tablet by mouth daily.  Marland Kitchen HYDROcodone-acetaminophen (NORCO) 10-325 MG tablet Take 1 tablet by mouth in the morning, at noon, and at bedtime.  Marland Kitchen lisinopril (ZESTRIL) 5 MG tablet Take 1 tablet by mouth daily.  . metoprolol succinate (TOPROL-XL) 100 MG 24 hr tablet Take 1 tablet (100 mg total) by mouth 2 (two) times daily.  . tamsulosin (FLOMAX) 0.4 MG CAPS capsule Take 0.4 mg by mouth at bedtime.     Allergies:   Tizanidine and Codeine   Social History   Socioeconomic History  . Marital status: Married    Spouse name: Not on file  . Number of children: Not on file  . Years of education: Not on file  . Highest education level: Not on file  Occupational History  . Occupation: Retired    Associate Professor:  Psychologist, educational    Comment: Worked as Veterinary surgeon for [employer]  . Smoking status: Former Smoker    Packs/day: 2.50    Years: 17.00    Pack years: 42.50    Types: Cigarettes    Quit date: 10/02/1971    Years since quitting: 48.9  . Smokeless tobacco: Never Used  Vaping Use  . Vaping Use: Never  used  Substance and Sexual Activity  . Alcohol use: No    Comment: none since 1977, heavy drinker prior  . Drug use: No  . Sexual activity: Not on file  Other Topics Concern  . Not on file  Social History Narrative  . Not on file   Social Determinants of Health   Financial Resource Strain:   . Difficulty of Paying Living Expenses: Not on file  Food Insecurity:   . Worried About Programme researcher, broadcasting/film/video in the Last Year: Not on file  . Ran Out of Food in the Last Year: Not on file  Transportation Needs:   . Lack of Transportation (Medical): Not on file  . Lack of Transportation (Non-Medical): Not on file  Physical Activity:   . Days of Exercise per Week: Not on file  . Minutes of Exercise per Session: Not on file  Stress:   . Feeling of Stress : Not on file  Social Connections:   . Frequency of Communication with Friends and Family: Not on file  . Frequency of Social Gatherings with Friends and Family: Not on file  . Attends Religious Services: Not on file  . Active Member of Clubs or Organizations: Not on file  . Attends Banker Meetings: Not on file  . Marital Status: Not on file     Family History: The patient's family history includes Aortic aneurysm in his brother; COPD in his brother; Diabetes in his brother and father; Heart failure in his brother; Hepatitis in his father; Stroke in his mother. ROS:   Please see the history of present illness.    All 14 point review of systems negative except as described per history of present illness  EKGs/Labs/Other Studies Reviewed:      Recent Labs: No results found for requested labs within last 8760 hours.   Recent Lipid Panel No results found for: CHOL, TRIG, HDL, CHOLHDL, VLDL, LDLCALC, LDLDIRECT  Physical Exam:    VS:  There were no vitals taken for this visit.    Wt Readings from Last 3 Encounters:  01/12/20 285 lb 6.4 oz (129.5 kg)  01/14/19 286 lb (129.7 kg)  07/18/18 295 lb 12.8 oz (134.2 kg)     GEN:  Well nourished, well developed in no acute distress HEENT: Normal NECK: No JVD; No carotid bruits LYMPHATICS: No lymphadenopathy CARDIAC: RRR, no murmurs, no rubs, no gallops RESPIRATORY:  Clear to auscultation without rales, wheezing or rhonchi  ABDOMEN: Soft, non-tender, non-distended MUSCULOSKELETAL:  No edema; No deformity  SKIN: Warm and dry LOWER EXTREMITIES: no swelling NEUROLOGIC:  Alert and oriented x 3 PSYCHIATRIC:  Normal affect   ASSESSMENT:    1. Permanent atrial fibrillation (HCC)   2. CHF (congestive heart failure), NYHA class III, acute, diastolic (HCC)   3. Dilated cardiomyopathy (HCC)    PLAN:    In order of problems listed above:  1. Permanent atrial fibrillation, he is anticoagulated Eliquis which I will continue. 2. Cardiomyopathy with ejection fraction 40 to 45% in April after COVID-19. He is on beta-blocker as well as ACE inhibitor. I will repeat echocardiogram if his ejection fraction is the same or worse we will switch him from lisinopril to [facility]. If his ejection fraction improve we will go to keep the same medications. 3. Dyslipidemia: I do not see any recent fasting lipid profile in the computer. When we decide about Entresto therapy will check Chem-7 and at the same time we will check fasting lipid profile and treat  accordingly. 4. History of DVT, anticoagulated.   Medication Adjustments/Labs and Tests Ordered: Current medicines are reviewed at length with the patient today.  Concerns regarding medicines are outlined above.  No orders of the defined types were placed in this encounter.  Medication changes: No orders of the defined types  were placed in this encounter.   Signed, Georgeanna Lea, MD, [facility] 09/07/2020 2:29 PM    [facility]

## 2020-09-07 NOTE — Addendum Note (Signed)
Addended by: Hazle Quant on: 09/07/2020 02:46 PM   Modules accepted: Orders

## 2020-10-10 ENCOUNTER — Ambulatory Visit (INDEPENDENT_AMBULATORY_CARE_PROVIDER_SITE_OTHER): Payer: Medicare Other

## 2020-10-10 ENCOUNTER — Other Ambulatory Visit: Payer: Self-pay

## 2020-10-10 DIAGNOSIS — I420 Dilated cardiomyopathy: Secondary | ICD-10-CM

## 2020-10-10 DIAGNOSIS — I5031 Acute diastolic (congestive) heart failure: Secondary | ICD-10-CM

## 2020-10-10 DIAGNOSIS — I4821 Permanent atrial fibrillation: Secondary | ICD-10-CM | POA: Diagnosis not present

## 2020-10-10 LAB — ECHOCARDIOGRAM COMPLETE
Area-P 1/2: 4.1 cm2
Calc EF: 49.5 %
S' Lateral: 4.2 cm
Single Plane A2C EF: 45.7 %
Single Plane A4C EF: 52.3 %

## 2020-10-10 NOTE — Progress Notes (Signed)
Complete echocardiogram performed.  Jimmy Treyven Lafauci RDCS, RVT  

## 2020-12-29 ENCOUNTER — Other Ambulatory Visit: Payer: Self-pay | Admitting: Cardiology

## 2021-02-03 ENCOUNTER — Other Ambulatory Visit: Payer: Self-pay

## 2021-02-08 ENCOUNTER — Ambulatory Visit (INDEPENDENT_AMBULATORY_CARE_PROVIDER_SITE_OTHER): Payer: Medicare Other | Admitting: Cardiology

## 2021-02-08 ENCOUNTER — Other Ambulatory Visit: Payer: Self-pay | Admitting: Cardiology

## 2021-02-08 ENCOUNTER — Other Ambulatory Visit: Payer: Self-pay

## 2021-02-08 VITALS — BP 138/76 | HR 74 | Ht 72.0 in | Wt 291.0 lb

## 2021-02-08 DIAGNOSIS — I119 Hypertensive heart disease without heart failure: Secondary | ICD-10-CM | POA: Diagnosis not present

## 2021-02-08 DIAGNOSIS — I4821 Permanent atrial fibrillation: Secondary | ICD-10-CM

## 2021-02-08 DIAGNOSIS — I420 Dilated cardiomyopathy: Secondary | ICD-10-CM | POA: Diagnosis not present

## 2021-02-08 DIAGNOSIS — E119 Type 2 diabetes mellitus without complications: Secondary | ICD-10-CM

## 2021-02-08 DIAGNOSIS — G4733 Obstructive sleep apnea (adult) (pediatric): Secondary | ICD-10-CM

## 2021-02-08 DIAGNOSIS — I482 Chronic atrial fibrillation, unspecified: Secondary | ICD-10-CM

## 2021-02-08 NOTE — Progress Notes (Signed)
Cardiology Office Note:    Date:  02/08/2021   ID:  MORT SMELSER, DOB 07/15/1941, MRN [id number]  PCP:  Dan Maker, MD  Cardiologist:  Gypsy Balsam, MD    Referring MD: Dan Maker, MD   Chief Complaint  Patient presents with  . Follow-up  Am doing fine  History of Present Illness:    Devin Rios is a 80 y.o. male with past medical history significant for cardiomyopathy then normalization of left ventricle ejection fraction based on last echocardiogram, permanent atrial fibrillation, anticoagulated, diabetes mellitus, essential hypertension, dyslipidemia.  I suspect also get significant severe sleep apnea. He is in my office today to talk about his issues although he said he doing well he walk climb stairs does what he can describe to have some exertional shortness of breath which is a chronic complaint.  There is some swelling of lower extremities which is chronic as well.  No chest pain tightness squeezing pressure burning chest.  Past Medical History:  Diagnosis Date  . Acute hypoxemic respiratory failure due to COVID-19 [facility]) 10/15/2019   Last Assessment & Plan:  Formatting of this note might be different from the original. Patient with acute hypoxic respiratory failure with O2 sat down to 76% on initial ER vital signs.  He did improve with supplemental oxygen but required high-flow nasal cannula initially.  Encourage proning as tolerated-which he has not been doing as he says it is uncomfortable.  Encourage the patient to continue  . Acute kidney injury (HCC) 10/15/2019   Last Assessment & Plan:  Formatting of this note might be different from the original. Patient's creatinine 2.2 on initial ED labs.  Creatinine has trended down to 1.4.  Continue to follow with PCP to ensure that this is his baseline.  . Acute on chronic kidney failure (HCC) 05/17/2016  . Acute respiratory failure with hypoxia (HCC) 10/15/2019   Last Assessment & Plan:  Formatting of this note  might be different from the original. Secondary to multifocal pneumonia with COVID infection. Patient has been weaned from 6 L nasal cannula to 4 L. continues to desat when he takes his oxygen level off into the lower 80s.  Reinforced with the patient as well as with his wife on a separate conversation the importance of keeping his oxygen in at all   . Arthritis    chronic back, knee, shoulder pain  . Atrial fibrillation (HCC)   . Atrial fibrillation with RVR (HCC) 09/15/2013  . Benign prostatic hyperplasia without lower urinary tract symptoms 11/13/2018  . Cardiomyopathy (HCC)   . Cataract, immature    right eye  . CHF (congestive heart failure), NYHA class III, acute, diastolic (HCC) 05/22/2016  . Chronic anticoagulation 11/13/2018  . Chronic atrial fibrillation (HCC) 07/14/2018   CHA2DS2VASC score 3   . Chronic kidney disease    Stage 3  . Chronic prescription opiate use 11/13/2018  . Chronic prostatitis 05/17/2016  . Chronic venous insufficiency   . CKD (chronic kidney disease), stage III (HCC) 09/15/2013  . CKD stage 3 due to type 2 diabetes mellitus (HCC) 08/08/2015  . Depression    due to limited mobility related to back problems  . Diabetes mellitus   . Diabetes mellitus (HCC) 05/09/2011  . Dilated cardiomyopathy (HCC) 07/18/2018   Ejection fraction 4045% based on echocardiogram from 2017  . DM (diabetes mellitus), type 2 with renal complications (HCC) 10/07/2014  . Dysrhythmia    afib- sees Dr. Donnie Aho, on Eliquis, will be  holding for surgery 05/2014  . Dysuria 12/27/2015  . Embolism - blood clot 05/09/2011   S/p knee surgery   . Herniation of lumbar intervertebral disc 02/02/2014  . History of DVT (deep vein thrombosis)   . Hypertension    stress test done, early 2015, wnl  . Hypertension associated with type 2 diabetes mellitus (HCC) 10/15/2019   Last Assessment & Plan:  Formatting of this note might be different from the original. Blood pressures been intermittently elevated.  Would  recommend that the patient resume his home medications for discharge and add low-dose lisinopril but would follow up with his PCP after hospitalization in 3 days for repeat blood pressure check, weaning oxygen, and adjustment of blood pressure medications as n  . Hypertensive heart disease   . Hypertensive heart disease without CHF 07/14/2018  . Hyponatremia 05/17/2016  . Kidney disease, chronic, stage III (GFR 30-59 ml/min) (HCC) 07/14/2018  . Kidney stones   . Leukocytosis 05/17/2016  . Long term current use of anticoagulant therapy 07/14/2018  . Long-term use of high-risk medication 10/15/2019   Last Assessment & Plan:  Formatting of this note might be different from the original. Patient is chronically on Eliquis for chronic AFib.  Continue Eliquis b.i.d. for discharge.  . Low back pain 10/21/2013  . Lumbar disc disease 03/10/2014  . Metatarsalgia of both feet 02/21/2016  . Morbid (severe) obesity due to excess calories (HCC) 11/13/2018  . Morbid obesity (HCC) 07/14/2018  . Morbid obesity due to excess calories (HCC) 10/15/2019   Last Assessment & Plan:  Formatting of this note might be different from the original. BMI 37.80.  Would recommend reduced calorie diet and increasing daily activity for weight loss after discharge.  . Morbid obesity with BMI of 40.0-44.9, adult (HCC) 01/11/2017  . Neuromuscular disorder (HCC)    lumbar HNP & stenosis   . Nonobstructive cardiomyopathy (HCC)   . Onychomycosis due to dermatophyte 02/21/2016  . Other acquired hammer toe 02/21/2016  . Peripheral vascular disease (HCC)    blood clot in leg- after knees arthroscope- R leg   . Permanent atrial fibrillation (HCC) 10/15/2019   Last Assessment & Plan:  Formatting of this note might be different from the original. Rate is currently controlled.  Continue metoprolol. Continue Eliquis.  . Pneumonia due to COVID-19 virus 10/15/2019   Last Assessment & Plan:  Formatting of this note might be different from the original.  Patient was tested on 01/05 at his PCP office.  His positive result came back on 01/08.  Patient completed a 5 day course of Remdesivir.  He will also be continued on melatonin, vitamin C, and zinc for discharge.  Encourage the patient to continue incentive spirometry and Acapella for discharge.  Vitamin-D level  . Primary insomnia 03/14/2015  . Shortness of breath    pt. attributes to afib  . Sleep apnea 8/12     pt denies; no cpap;dx by Dr. Shelle Iron  . Sleep apnea   . Type 2 diabetes mellitus without complication, without long-term current use of insulin (HCC) 10/15/2019   Last Assessment & Plan:  Formatting of this note might be different from the original. Patient's hemoglobin A1c is 6.4. Patient continues to have some hyperglycemia likely steroid induced.  Continue glipizide 5 mg b.i.d.Marland Kitchen    Blood sugars have ranged from a 158-306 in last 24 hours.  Resume home medication regimen for discharge.  Hyperglycemia should improve as patient discontinues steroids in 5 da  . Urethral stricture   .  Urinary tract infection due to Proteus 10/15/2019   Last Assessment & Plan:  Formatting of this note might be different from the original. Patient had evidence on urinalysis of UTI with positive leukocytes, nitrite, evidence of WBCs as well as moderate amount of bacteria.  Patient was given Rocephin in the ED.  He was continued on Rocephin and has completed a 5 day course which should be sufficient for complete treatment.  Urine culture with growth  . Vitamin D deficiency 10/07/2014    Past Surgical History:  Procedure Laterality Date  . BACK SURGERY    . COLONOSCOPY     x 2  . CYSTOSCOPY WITH URETHRAL DILATATION  10/28/2012   Procedure: CYSTOSCOPY WITH URETHRAL DILATATION;  Surgeon: Antony HasteMatthew Ramsey Eskridge, MD;  [facility] [place];  Service: Urology;  Laterality: N/A;  visual dilatation  . HEMORROIDECTOMY    . KNEE SURGERY Right   . LUMBAR LAMINECTOMY/DECOMPRESSION MICRODISCECTOMY Left  05/28/2014   Procedure: Left Lumbar Three-Four Microdiskectomy;  Surgeon: Maeola HarmanJoseph Stern, MD;  Location: [facility];  Service: Neurosurgery;  Laterality: Left;  Left L3-4 Microdiskectomy  . LUMBAR LAMINECTOMY/DECOMPRESSION MICRODISCECTOMY Left 04/13/2016   Procedure: Redo Microdiscectomy - left - Lumbar three--Lumbar four;  Surgeon: Maeola HarmanJoseph Stern, MD;  Location: [facility];  Service: Neurosurgery;  Laterality: Left;  . SHOULDER SURGERY Right   . TONSILLECTOMY    . VASECTOMY      Current Medications: Current Meds  Medication Sig  . amLODipine (NORVASC) 10 MG tablet Take 1 tablet by mouth daily.  . Cholecalciferol 125 MCG/ML LIQD Take 2 capsules by mouth daily.  Marland Kitchen. ELIQUIS 5 MG TABS tablet TAKE ONE (1) TABLET BY MOUTH TWO (2) TIMES DAILY (Patient taking differently: Take 5 mg by mouth 2 (two) times daily.)  . finasteride (PROSCAR) 5 MG tablet Take 1 tablet by mouth daily.  . furosemide (LASIX) 40 MG tablet Take 40 mg by mouth 2 (two) times a week.  Marland Kitchen. glipiZIDE (GLUCOTROL XL) 10 MG 24 hr tablet Take 1 tablet by mouth daily.  Marland Kitchen. HYDROcodone-acetaminophen (NORCO) 10-325 MG tablet Take 1 tablet by mouth 3 (three) times daily as needed for pain.  . metoprolol succinate (TOPROL-XL) 100 MG 24 hr tablet TAKE ONE (1) TABLET BY MOUTH TWO (2) TIMES DAILY (Patient taking differently: Take 100 mg by mouth daily.)  . tamsulosin (FLOMAX) 0.4 MG CAPS capsule Take 0.4 mg by mouth at bedtime.  . [DISCONTINUED] clonazePAM (KLONOPIN) 1 MG tablet Take 1 mg by mouth at bedtime as needed (Sleep).     Allergies:   Tizanidine, Ferrous sulfate, and Codeine   Social History   Socioeconomic History  . Marital status: Married    Spouse name: Not on file  . Number of children: Not on file  . Years of education: Not on file  . Highest education level: Not on file  Occupational History  . Occupation: Retired    Associate Professormployer: [employer]    Comment: Worked as Veterinary surgeonheriff for [employer]  Tobacco Use  . Smoking status:  Former Smoker    Packs/day: 2.50    Years: 17.00    Pack years: 42.50    Types: Cigarettes    Quit date: 10/02/1971    Years since quitting: 49.3  . Smokeless tobacco: Never Used  Vaping Use  . Vaping Use: Never used  Substance and Sexual Activity  . Alcohol use: No    Comment: none since 1977, heavy drinker prior  . Drug use: No  . Sexual  activity: Not on file  Other Topics Concern  . Not on file  Social History Narrative  . Not on file   Social Determinants of Health   Financial Resource Strain: Not on file  Food Insecurity: Not on file  Transportation Needs: Not on file  Physical Activity: Not on file  Stress: Not on file  Social Connections: Not on file     Family History: The patient's family history includes Aortic aneurysm in his brother; COPD in his brother; Diabetes in his brother and father; Heart failure in his brother; Hepatitis in his father; Stroke in his mother. ROS:   Please see the history of present illness.    All 14 point review of systems negative except as described per history of present illness  EKGs/Labs/Other Studies Reviewed:      Recent Labs: No results found for requested labs within last 8760 hours.  Recent Lipid Panel No results found for: CHOL, TRIG, HDL, CHOLHDL, VLDL, LDLCALC, LDLDIRECT  Physical Exam:    VS:  BP 138/76 (BP Location: Left Arm, Patient Position: Sitting)   Pulse 74   Ht 6' (1.829 m)   Wt 291 lb (132 kg)   SpO2 93%   BMI 39.47 kg/m     Wt Readings from Last 3 Encounters:  02/08/21 291 lb (132 kg)  09/07/20 295 lb (133.8 kg)  01/12/20 285 lb 6.4 oz (129.5 kg)     GEN:  Well nourished, well developed in no acute distress HEENT: Normal NECK: No JVD; No carotid bruits LYMPHATICS: No lymphadenopathy CARDIAC: Irregularly irregular, no murmurs, no rubs, no gallops RESPIRATORY:  Clear to auscultation without rales, wheezing or rhonchi  ABDOMEN: Soft, non-tender, non-distended MUSCULOSKELETAL:  No edema; No  deformity  SKIN: Warm and dry LOWER EXTREMITIES: no swelling NEUROLOGIC:  Alert and oriented x 3 PSYCHIATRIC:  Normal affect   ASSESSMENT:    1. Hypertensive heart disease without CHF   2. Permanent atrial fibrillation (HCC)   3. Dilated cardiomyopathy (HCC)   4. Type 2 diabetes mellitus without complication, without long-term current use of insulin (HCC)   5. Chronic atrial fibrillation (HCC)   6. Obstructive sleep apnea    PLAN:    In order of problems listed above:  1. Cardiomyopathy with previously ejection fraction 45% now latest echocardiogram normalization, he is on Toprol-XL 100 mg daily as well as lisinopril 5 which I will continue. 2. Permanent atrial fibrillation, rate is controlled and is accomplished with metoprolol which I will continue.  He is anticoagulated with Eliquis, he is CHADS2 Vascor equals 4. 3. Type 2 diabetes.  I do not have any information about status of his diabetes he said he takes 1 medication for many years and everything is stable. 4. Dyslipidemia.  He is not taking any cholesterol medication.  With his diabetes I think he need to be on cholesterol medication.  I will check his cholesterol today and then treat this appropriately. 5. Obstructive sleep apnea he said that he did have a sleep study done years ago he was told that he had significant sleep apnea he was asked to wear mask however he refused I told him that probably that knee issue need to be revisited and explained to him what sleep apnea is one of the benefits of this clearly problem is uncontrolled and he can benefit from proper management of it.  He does not want to do it however.   Medication Adjustments/Labs and Tests Ordered: Current medicines are reviewed at length with  the patient today.  Concerns regarding medicines are outlined above.  Orders Placed This Encounter  Procedures  . Lipid panel  . EKG 12-Lead   Medication changes: No orders of the defined types were placed in this  encounter.   Signed, Georgeanna Lea, MD, [facility] 02/08/2021 2:47 PM    [facility]

## 2021-02-08 NOTE — Patient Instructions (Signed)

## 2021-02-08 NOTE — Addendum Note (Signed)
Addended by: Hazle Quant on: 02/08/2021 02:57 PM   Modules accepted: Orders

## 2021-02-09 LAB — LIPID PANEL
Chol/HDL Ratio: 2.7 ratio (ref 0.0–5.0)
Cholesterol, Total: 147 mg/dL (ref 100–199)
HDL: 54 mg/dL (ref >39)
LDL Chol Calc (NIH): 77 mg/dL (ref 0–99)
Triglycerides: 83 mg/dL (ref 0–149)
VLDL Cholesterol Cal: 16 mg/dL (ref 5–40)

## 2021-06-28 ENCOUNTER — Other Ambulatory Visit: Payer: Self-pay | Admitting: Cardiology

## 2021-06-28 NOTE — Telephone Encounter (Signed)
Amlodipine 10 mg # 90 x 3 refills sent to [facility] - [place], [place] - [id number]

## 2021-09-05 ENCOUNTER — Ambulatory Visit: Payer: Medicare Other | Admitting: Cardiology

## 2021-12-28 ENCOUNTER — Other Ambulatory Visit: Payer: Self-pay | Admitting: Cardiology

## 2021-12-28 NOTE — Telephone Encounter (Signed)
Prescription refill request for Eliquis received. ?Indication:Afib ?Last office visit:5/22 ?Scr:1.5 ?Age: 81 ?Weight:132 kg ? ?Prescription refilled ? ?

## 2022-01-10 ENCOUNTER — Ambulatory Visit: Payer: Medicare Other | Admitting: Cardiology

## 2022-02-16 ENCOUNTER — Other Ambulatory Visit: Payer: Self-pay | Admitting: Cardiology

## 2022-02-19 NOTE — Telephone Encounter (Signed)
Rx refill sent to pharmacy. 

## 2022-03-26 ENCOUNTER — Ambulatory Visit (INDEPENDENT_AMBULATORY_CARE_PROVIDER_SITE_OTHER): Payer: Medicare Other | Admitting: Cardiology

## 2022-03-26 ENCOUNTER — Encounter: Payer: Self-pay | Admitting: Cardiology

## 2022-03-26 VITALS — BP 102/58 | HR 76 | Ht 72.0 in | Wt 293.0 lb

## 2022-03-26 DIAGNOSIS — G4733 Obstructive sleep apnea (adult) (pediatric): Secondary | ICD-10-CM

## 2022-03-26 DIAGNOSIS — I119 Hypertensive heart disease without heart failure: Secondary | ICD-10-CM | POA: Diagnosis not present

## 2022-03-26 DIAGNOSIS — I482 Chronic atrial fibrillation, unspecified: Secondary | ICD-10-CM | POA: Diagnosis not present

## 2022-03-26 DIAGNOSIS — N183 Chronic kidney disease, stage 3 unspecified: Secondary | ICD-10-CM

## 2022-03-26 DIAGNOSIS — I420 Dilated cardiomyopathy: Secondary | ICD-10-CM

## 2022-03-26 DIAGNOSIS — E1122 Type 2 diabetes mellitus with diabetic chronic kidney disease: Secondary | ICD-10-CM

## 2022-03-26 MED ORDER — AMLODIPINE BESYLATE 5 MG PO TABS
5.0000 mg | ORAL_TABLET | Freq: Every day | ORAL | 3 refills | Status: DC
Start: 2022-03-26 — End: 2023-08-10

## 2022-03-26 MED ORDER — METOPROLOL SUCCINATE ER 100 MG PO TB24: ORAL_TABLET | ORAL | 3 refills | Status: DC

## 2022-03-26 NOTE — Progress Notes (Signed)
Cardiology Office Note:    Date:  03/26/2022   ID:  FRANCISZEK PLATTEN, DOB 1941/04/07, MRN [id number]  PCP:  Maye Hides, PA  Cardiologist:  Gypsy Balsam, MD    Referring MD: Dan Maker, MD   Chief Complaint  Patient presents with   Atrial Fibrillation         History of Present Illness:    Devin Rios is a 81 y.o. male with past medical history significant for cardiomyopathy normalization based on last echocardiogram, moderate mitral regurgitation, permanent atrial fibrillation, obstructive sleep apnea, diabetes, essential hypertension, dyslipidemia. She comes today to my office for follow-up overall she is doing well.  He had a rough year last year he fell off the balcony of his house tripped over the cat and then ended up falling down.  Or injuring his head injuring his leg so I have it with no long-term consequences.  Cardiac wise doing well described to have some shortness of breath swelling of lower extremities no chest pain tightness squeezing pressure burning chest  Past Medical History:  Diagnosis Date   Acute hypoxemic respiratory failure due to COVID-19 [facility]) 10/15/2019   Last Assessment & Plan:  Formatting of this note might be different from the original. Patient with acute hypoxic respiratory failure with O2 sat down to 76% on initial ER vital signs.  He did improve with supplemental oxygen but required high-flow nasal cannula initially.  Encourage proning as tolerated-which he has not been doing as he says it is uncomfortable.  Encourage the patient to continue   Acute kidney injury (HCC) 10/15/2019   Last Assessment & Plan:  Formatting of this note might be different from the original. Patient's creatinine 2.2 on initial ED labs.  Creatinine has trended down to 1.4.  Continue to follow with PCP to ensure that this is his baseline.   Acute on chronic kidney failure (HCC) 05/17/2016   Acute respiratory failure with hypoxia (HCC) 10/15/2019   Last  Assessment & Plan:  Formatting of this note might be different from the original. Secondary to multifocal pneumonia with COVID infection. Patient has been weaned from 6 L nasal cannula to 4 L. continues to desat when he takes his oxygen level off into the lower 80s.  Reinforced with the patient as well as with his wife on a separate conversation the importance of keeping his oxygen in at all    Arthritis    chronic back, knee, shoulder pain   Atrial fibrillation (HCC)    Atrial fibrillation with RVR (HCC) 09/15/2013   Benign prostatic hyperplasia without lower urinary tract symptoms 11/13/2018   Cardiomyopathy (HCC)    Cataract, immature    right eye   CHF (congestive heart failure), NYHA class III, acute, diastolic (HCC) 05/22/2016   Chronic anticoagulation 11/13/2018   Chronic atrial fibrillation (HCC) 07/14/2018   CHA2DS2VASC score 3    Chronic kidney disease    Stage 3   Chronic prescription opiate use 11/13/2018   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 Donnie Aho, on Eliquis, will be  holding for surgery 05/2014   Dysuria 12/27/2015   Embolism - blood clot 05/09/2011   S/p knee surgery    Herniation of lumbar intervertebral disc 02/02/2014   History of DVT (deep vein thrombosis)    Hypertension    stress test done, early 2015, wnl   Hypertension associated with type 2 diabetes mellitus (HCC) 10/15/2019   Last Assessment & Plan:  Formatting of this note might be different from the original. Blood pressures been intermittently  elevated.  Would recommend that the patient resume his home medications for discharge and add low-dose lisinopril but would follow up with his PCP after hospitalization in 3 days for repeat blood pressure check, weaning oxygen, and adjustment of blood pressure medications as n   Hypertensive heart disease    Hypertensive heart disease without CHF 07/14/2018   Hyponatremia 05/17/2016   Kidney disease, chronic, stage III (GFR 30-59 ml/min) (HCC) 07/14/2018   Kidney stones    Leukocytosis 05/17/2016   Long term current use of anticoagulant therapy 07/14/2018   Long-term use of high-risk medication 10/15/2019   Last Assessment & Plan:  Formatting of this note might be different from the original. Patient is chronically on Eliquis for chronic AFib.  Continue Eliquis b.i.d. for discharge.   Low back pain 10/21/2013   Lumbar disc disease 03/10/2014   Metatarsalgia of both feet 02/21/2016   Morbid (severe) obesity due to excess calories (HCC) 11/13/2018   Morbid obesity (HCC) 07/14/2018   Morbid obesity due to excess calories (HCC) 10/15/2019   Last Assessment & Plan:  Formatting of this note might be different from the original. BMI 37.80.  Would recommend reduced calorie diet and increasing daily activity for weight loss after discharge.   Morbid obesity with BMI of 40.0-44.9, adult (HCC) 01/11/2017   Neuromuscular disorder (HCC)    lumbar HNP & stenosis    Nonobstructive cardiomyopathy (HCC)    Onychomycosis due to dermatophyte 02/21/2016   Other acquired hammer toe 02/21/2016   Peripheral vascular disease (HCC)    blood clot in leg- after knees arthroscope- R leg    Permanent atrial fibrillation (HCC) 10/15/2019   Last Assessment & Plan:  Formatting of this note might be different from the original. Rate is currently controlled.  Continue metoprolol. Continue Eliquis.   Pneumonia due to COVID-19 virus 10/15/2019   Last Assessment & Plan:  Formatting of this note might be different from the original.  Patient was tested on 01/05 at his PCP office.  His positive result came back on 01/08.  Patient completed a 5 day course of Remdesivir.  He will also be continued on melatonin, vitamin C, and zinc for discharge.  Encourage the patient to continue incentive spirometry and Acapella for discharge.  Vitamin-D level   Primary insomnia 03/14/2015   Shortness of breath    pt. attributes to afib   Sleep apnea 8/12     pt denies; no cpap;dx by Dr. Shelle Iron   Sleep apnea    Type 2 diabetes mellitus without complication, without long-term current use of insulin (HCC) 10/15/2019   Last Assessment & Plan:  Formatting of this note might be different from the original. Patient's hemoglobin A1c is 6.4. Patient continues to have some hyperglycemia likely steroid induced.  Continue glipizide 5 mg b.i.d.Marland Kitchen    Blood sugars have ranged from a 158-306 in last 24 hours.  Resume home medication regimen for discharge.  Hyperglycemia should  improve as patient discontinues steroids in 5 da   Urethral stricture    Urinary tract infection due to Proteus 10/15/2019   Last Assessment & Plan:  Formatting of this note might be different from the original. Patient had evidence on urinalysis of UTI with positive leukocytes, nitrite, evidence of WBCs as well as moderate amount of bacteria.  Patient was given Rocephin in the ED.  He was continued on Rocephin and has completed a 5 day course which should be sufficient for complete treatment.  Urine culture with growth   Vitamin D deficiency 10/07/2014    Past Surgical History:  Procedure Laterality Date   BACK SURGERY     COLONOSCOPY     x 2   CYSTOSCOPY WITH URETHRAL DILATATION  10/28/2012   Procedure: CYSTOSCOPY WITH URETHRAL DILATATION;  Surgeon: Fredricka Bonine, MD;  Location[facility];  Service: Urology;  Laterality: N/A;  visual dilatation   HEMORROIDECTOMY     KNEE SURGERY Right    LUMBAR LAMINECTOMY/DECOMPRESSION MICRODISCECTOMY Left 05/28/2014    Procedure: Left Lumbar Three-Four Microdiskectomy;  Surgeon: Erline Levine, MD;  Location: [facility];  Service: Neurosurgery;  Laterality: Left;  Left L3-4 Microdiskectomy   LUMBAR LAMINECTOMY/DECOMPRESSION MICRODISCECTOMY Left 04/13/2016   Procedure: Redo Microdiscectomy - left - Lumbar three--Lumbar four;  Surgeon: Erline Levine, MD;  Location: [facility];  Service: Neurosurgery;  Laterality: Left;   SHOULDER SURGERY Right    TONSILLECTOMY     VASECTOMY      Current Medications: Current Meds  Medication Sig   amLODipine (NORVASC) 10 MG tablet TAKE 1 TABLET BY MOUTH DAILY WITH BUMEX (Patient taking differently: Take 10 mg by mouth daily.)   Cholecalciferol 125 MCG/ML LIQD Take 2 capsules by mouth daily.   ELIQUIS 5 MG TABS tablet TAKE ONE (1) TABLET BY MOUTH TWO (2) TIMES DAILY (Patient taking differently: Take 5 mg by mouth 2 (two) times daily.)   finasteride (PROSCAR) 5 MG tablet Take 1 tablet by mouth daily.   furosemide (LASIX) 40 MG tablet Take 40 mg by mouth 2 (two) times a week.   glipiZIDE (GLUCOTROL XL) 10 MG 24 hr tablet Take 1 tablet by mouth daily.   HYDROcodone-acetaminophen (NORCO) 10-325 MG tablet Take 1 tablet by mouth 3 (three) times daily as needed for pain.   levofloxacin (LEVAQUIN) 500 MG tablet Take 500 mg by mouth 2 (two) times daily. Has 2 1/2 days remaining   lisinopril (ZESTRIL) 5 MG tablet Take 5 mg by mouth daily.   metoprolol succinate (TOPROL-XL) 100 MG 24 hr tablet Take 1 tablet (100 mg total) by mouth daily.   tamsulosin (FLOMAX) 0.4 MG CAPS capsule Take 0.4 mg by mouth at bedtime.     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, agricultural  for [employer]   Smoking status: Former    Packs/day: 2.50    Years: 17.00    Total pack years: 42.50    Types: Cigarettes    Quit date: 10/02/1971    Years since quitting: 50.5   Smokeless tobacco: Never  Vaping Use   Vaping  Use: Never used  Substance and Sexual Activity   Alcohol use: No    Comment: none since 1977, heavy drinker prior   Drug use: No   Sexual activity: Not on file  Other Topics Concern   Not on file  Social History Narrative   Not on file   Social Determinants of Health   Financial Resource Strain: Not on file  Food Insecurity: Not on file  Transportation Needs: Not on file  Physical Activity: Not on file  Stress: Not on file  Social Connections: Not on file     Family History: The patient's family history includes Aortic aneurysm in his brother; COPD in his brother; Diabetes in his brother and father; Heart failure in his brother; Hepatitis in his father; Stroke in his mother. ROS:   Please see the history of present illness.    All 14 point review of systems negative except as described per history of present illness  EKGs/Labs/Other Studies Reviewed:      Recent Labs: No results found for requested labs within last 365 days.  Recent Lipid Panel    Component Value Date/Time   CHOL 147 02/08/2021 1515   TRIG 83 02/08/2021 1515   HDL 54 02/08/2021 1515   CHOLHDL 2.7 02/08/2021 1515   LDLCALC 77 02/08/2021 1515    Physical Exam:    VS:  BP (!) 102/58 (BP Location: Left Arm, Patient Position: Sitting)   Pulse 76   Ht 6' (1.829 m)   Wt 293 lb (132.9 kg)   SpO2 98%   BMI 39.74 kg/m     Wt Readings from Last 3 Encounters:  03/26/22 293 lb (132.9 kg)  02/08/21 291 lb (132 kg)  09/07/20 295 lb (133.8 kg)     GEN:  Well nourished, well developed in no acute distress HEENT: Normal NECK: No JVD; No carotid bruits LYMPHATICS: No lymphadenopathy CARDIAC: RRR, no murmurs, no rubs, no gallops RESPIRATORY:  Clear to auscultation  without rales, wheezing or rhonchi  ABDOMEN: Soft, non-tender, non-distended MUSCULOSKELETAL:  No edema; No deformity  SKIN: Warm and dry LOWER EXTREMITIES: no swelling NEUROLOGIC:  Alert and oriented x 3 PSYCHIATRIC:  Normal affect   ASSESSMENT:    1. Chronic atrial fibrillation (HCC)   2. Hypertensive heart disease without CHF   3. Dilated cardiomyopathy (HCC)   4. Obstructive sleep apnea   5. CKD stage 3 due to type 2 diabetes mellitus (HCC)    PLAN:    In order of problems listed above:  Chronic atrial fibrillation rate controlled, anticoagulated which I will continue interestingly he tells me that he used to take 200 mg of Toprol-XL and now he is getting prescription only 400 he wants me to give him prescription for 200 daily documents elevated much since his heart rate today 77 but he mentioned that in the morning his heart rate is very fast therefore I asked him to take 1 tablet in the morning of 100 and then at evening time only 50 mg extra.  Continue anticoagulation Essential hypertension actually blood pressures on the lower side I will reduce his amlodipine to only 5 mg daily History of dilated cardiomyopathy with mitral regurgitation schedule him to have an echocardiogram done Obstructive sleep apnea which is quite severe he does not want to do anything about it Kidney failure, followed by internal medicine team stable Dyslipidemia I did review K PN which show me his LDL 77 HDL 54 this is however from last year.   Medication Adjustments/Labs  and Tests Ordered: Current medicines are reviewed at length with the patient today.  Concerns regarding medicines are outlined above.  No orders of the defined types were placed in this encounter.  Medication changes: No orders of the defined types were placed in this encounter.   Signed, Park Liter, MD, [facility] 03/26/2022 2:04 PM    [place]

## 2022-04-04 ENCOUNTER — Ambulatory Visit (INDEPENDENT_AMBULATORY_CARE_PROVIDER_SITE_OTHER): Payer: Medicare Other

## 2022-04-04 DIAGNOSIS — I482 Chronic atrial fibrillation, unspecified: Secondary | ICD-10-CM

## 2022-04-04 DIAGNOSIS — I420 Dilated cardiomyopathy: Secondary | ICD-10-CM

## 2022-04-04 LAB — ECHOCARDIOGRAM COMPLETE
Area-P 1/2: 4.8 cm2
S' Lateral: 3 cm

## 2022-04-04 MED ORDER — PERFLUTREN LIPID MICROSPHERE
1.0000 mL | INTRAVENOUS | Status: AC | PRN
  Administered 2022-04-04: 4 mL via INTRAVENOUS

## 2022-08-01 ENCOUNTER — Other Ambulatory Visit: Payer: Self-pay | Admitting: Cardiology

## 2022-08-01 NOTE — Telephone Encounter (Signed)
Prescription refill request for Eliquis received. Indication: AF Last office visit: 03/26/22  Nelta Numbers MD Scr: 1.55 on 12/07/21 Age: 81 Weight: 132.9kg  Pt is currently on Eliquis 5mg  twice daily. Based on above findings Eliquis 2.5mg  daily would be the appropriate dose due to age and SCr.  Will forward to Pharm D Pool to advise on dose reduction.  Per C Pavero PharmD, continue Eliquis 5mg  twice daily and have pt repeat BMP in 1 month. Message sent to [facility] office to schedule lab.

## 2023-02-07 DIAGNOSIS — I2699 Other pulmonary embolism without acute cor pulmonale: Secondary | ICD-10-CM | POA: Insufficient documentation

## 2023-02-13 DIAGNOSIS — I5022 Chronic systolic (congestive) heart failure: Secondary | ICD-10-CM | POA: Insufficient documentation

## 2023-03-21 ENCOUNTER — Ambulatory Visit: Payer: Medicare Other | Admitting: Cardiology

## 2023-03-29 ENCOUNTER — Ambulatory Visit: Payer: Medicare Other | Attending: Cardiology | Admitting: Cardiology

## 2023-03-29 ENCOUNTER — Encounter: Payer: Self-pay | Admitting: Cardiology

## 2023-03-29 VITALS — BP 148/98 | HR 107 | Ht 72.0 in | Wt 247.6 lb

## 2023-03-29 DIAGNOSIS — I4821 Permanent atrial fibrillation: Secondary | ICD-10-CM | POA: Diagnosis not present

## 2023-03-29 DIAGNOSIS — I5022 Chronic systolic (congestive) heart failure: Secondary | ICD-10-CM

## 2023-03-29 DIAGNOSIS — E119 Type 2 diabetes mellitus without complications: Secondary | ICD-10-CM

## 2023-03-29 DIAGNOSIS — I2699 Other pulmonary embolism without acute cor pulmonale: Secondary | ICD-10-CM | POA: Diagnosis not present

## 2023-03-29 DIAGNOSIS — Z7984 Long term (current) use of oral hypoglycemic drugs: Secondary | ICD-10-CM

## 2023-03-29 DIAGNOSIS — I482 Chronic atrial fibrillation, unspecified: Secondary | ICD-10-CM

## 2023-03-29 NOTE — Progress Notes (Signed)
Cardiology Office Note:    Date:  03/29/2023   ID:  Devin Rios, DOB 29-Apr-1941, MRN [id number]  PCP:  Maye Hides, PA  Cardiologist:  Gypsy Balsam, MD    Referring MD: Maye Hides, [place]   Chief Complaint  Patient presents with   Follow-up    History of Present Illness:    Devin Rios is a 82 y.o. male with past medical history significant for cardiomyopathy apparently normalization but look like recently he was hospitalized in one of the hospital [facility] and he was find to have ejection fraction 4045%, moderate mitral regurgitation, permanent atrial fibrillation, obstructive sleep apnea, diabetes, essential hypertension, dyslipidemia. Comes today to months for follow-up.  Overall he is doing fair.  He said that he required multiple hospital observation within the last year since have seen him last time.  Described the fact that majority of time he just simply sits in the chair and watch TV.  Does not do much try to walk inside a little bit but usually just stay at home.  No palpitations no chest pain no tightness no squeezing no pressure no burning in the chest  Past Medical History:  Diagnosis Date   Acute hypoxemic respiratory failure due to COVID-19 [facility]) 10/15/2019   Last Assessment & Plan:  Formatting of this note might be different from the original. Patient with acute hypoxic respiratory failure with O2 sat down to 76% on initial ER vital signs.  He did improve with supplemental oxygen but required high-flow nasal cannula initially.  Encourage proning as tolerated-which he has not been doing as he says it is uncomfortable.  Encourage the patient to continue   Acute kidney injury (HCC) 10/15/2019   Last Assessment & Plan:  Formatting of this note might be different from the original. Patient's creatinine 2.2 on initial ED labs.  Creatinine has trended down to 1.4.  Continue to follow with PCP to ensure that this is his baseline.   Acute on chronic kidney failure  (HCC) 05/17/2016   Acute respiratory failure with hypoxia (HCC) 10/15/2019   Last Assessment & Plan:  Formatting of this note might be different from the original. Secondary to multifocal pneumonia with COVID infection. Patient has been weaned from 6 L nasal cannula to 4 L. continues to desat when he takes his oxygen level off into the lower 80s.  Reinforced with the patient as well as with his wife on a separate conversation the importance of keeping his oxygen in at all    Arthritis    chronic back, knee, shoulder pain   Atrial fibrillation (HCC)    Atrial fibrillation with RVR (HCC) 09/15/2013   Benign prostatic hyperplasia without lower urinary tract symptoms 11/13/2018   Cardiomyopathy (HCC)    Cataract, immature    right eye   CHF (congestive heart failure), NYHA class III, acute, diastolic (HCC) 05/22/2016   Chronic anticoagulation 11/13/2018   Chronic atrial fibrillation (HCC) 07/14/2018   CHA2DS2VASC score 3    Chronic kidney disease    Stage 3   Chronic prescription opiate use 11/13/2018   Chronic prostatitis 05/17/2016   Chronic venous insufficiency    CKD (chronic kidney disease), stage III (HCC) 09/15/2013   CKD stage 3 due to type 2 diabetes mellitus (HCC) 08/08/2015   Depression    due to limited mobility related to back problems   Diabetes mellitus    Diabetes mellitus (HCC) 05/09/2011   Dilated cardiomyopathy (HCC) 07/18/2018   Ejection fraction 4045% based  on echocardiogram from 2017   DM (diabetes mellitus), type 2 with renal complications (HCC) 10/07/2014   Dysrhythmia    afib- sees Dr. Donnie Aho, on Eliquis, will be  holding for surgery 05/2014   Dysuria 12/27/2015   Embolism - blood clot 05/09/2011   S/p knee surgery    Herniation of lumbar intervertebral disc 02/02/2014   History of DVT (deep vein thrombosis)    Hypertension    stress test done, early 2015, wnl   Hypertension associated with type 2 diabetes mellitus (HCC) 10/15/2019   Last Assessment & Plan:  Formatting of  this note might be different from the original. Blood pressures been intermittently elevated.  Would recommend that the patient resume his home medications for discharge and add low-dose lisinopril but would follow up with his PCP after hospitalization in 3 days for repeat blood pressure check, weaning oxygen, and adjustment of blood pressure medications as n   Hypertensive heart disease    Hypertensive heart disease without CHF 07/14/2018   Hyponatremia 05/17/2016   Kidney disease, chronic, stage III (GFR 30-59 ml/min) (HCC) 07/14/2018   Kidney stones    Leukocytosis 05/17/2016   Long term current use of anticoagulant therapy 07/14/2018   Long-term use of high-risk medication 10/15/2019   Last Assessment & Plan:  Formatting of this note might be different from the original. Patient is chronically on Eliquis for chronic AFib.  Continue Eliquis b.i.d. for discharge.   Low back pain 10/21/2013   Lumbar disc disease 03/10/2014   Metatarsalgia of both feet 02/21/2016   Morbid (severe) obesity due to excess calories (HCC) 11/13/2018   Morbid obesity (HCC) 07/14/2018   Morbid obesity due to excess calories (HCC) 10/15/2019   Last Assessment & Plan:  Formatting of this note might be different from the original. BMI 37.80.  Would recommend reduced calorie diet and increasing daily activity for weight loss after discharge.   Morbid obesity with BMI of 40.0-44.9, adult (HCC) 01/11/2017   Neuromuscular disorder (HCC)    lumbar HNP & stenosis    Nonobstructive cardiomyopathy (HCC)    Onychomycosis due to dermatophyte 02/21/2016   Other acquired hammer toe 02/21/2016   Peripheral vascular disease (HCC)    blood clot in leg- after knees arthroscope- R leg    Permanent atrial fibrillation (HCC) 10/15/2019   Last Assessment & Plan:  Formatting of this note might be different from the original. Rate is currently controlled.  Continue metoprolol. Continue Eliquis.   Pneumonia due to COVID-19 virus 10/15/2019   Last  Assessment & Plan:  Formatting of this note might be different from the original. Patient was tested on 01/05 at his PCP office.  His positive result came back on 01/08.  Patient completed a 5 day course of Remdesivir.  He will also be continued on melatonin, vitamin C, and zinc for discharge.  Encourage the patient to continue incentive spirometry and Acapella for discharge.  Vitamin-D level   Primary insomnia 03/14/2015   Shortness of breath    pt. attributes to afib   Sleep apnea 8/12     pt denies; no cpap;dx by Dr. Shelle Iron   Sleep apnea    Type 2 diabetes mellitus without complication, without long-term current use of insulin (HCC) 10/15/2019   Last Assessment & Plan:  Formatting of this note might be different from the original. Patient's hemoglobin A1c is 6.4. Patient continues to have some hyperglycemia likely steroid induced.  Continue glipizide 5 mg b.i.d.Marland Kitchen    Blood sugars have  ranged from a 158-306 in last 24 hours.  Resume home medication regimen for discharge.  Hyperglycemia should improve as patient discontinues steroids in 5 da   Urethral stricture    Urinary tract infection due to Proteus 10/15/2019   Last Assessment & Plan:  Formatting of this note might be different from the original. Patient had evidence on urinalysis of UTI with positive leukocytes, nitrite, evidence of WBCs as well as moderate amount of bacteria.  Patient was given Rocephin in the ED.  He was continued on Rocephin and has completed a 5 day course which should be sufficient for complete treatment.  Urine culture with growth   Vitamin D deficiency 10/07/2014    Past Surgical History:  Procedure Laterality Date   BACK SURGERY     COLONOSCOPY     x 2   CYSTOSCOPY WITH URETHRAL DILATATION  10/28/2012   Procedure: CYSTOSCOPY WITH URETHRAL DILATATION;  Surgeon: Antony Haste, MD;  Location: [facility];  Service: Urology;  Laterality: N/A;  visual dilatation   HEMORROIDECTOMY     KNEE SURGERY  Right    LUMBAR LAMINECTOMY/DECOMPRESSION MICRODISCECTOMY Left 05/28/2014   Procedure: Left Lumbar Three-Four Microdiskectomy;  Surgeon: Maeola Harman, MD;  Location: [facility];  Service: Neurosurgery;  Laterality: Left;  Left L3-4 Microdiskectomy   LUMBAR LAMINECTOMY/DECOMPRESSION MICRODISCECTOMY Left 04/13/2016   Procedure: Redo Microdiscectomy - left - Lumbar three--Lumbar four;  Surgeon: Maeola Harman, MD;  Location: [facility];  Service: Neurosurgery;  Laterality: Left;   SHOULDER SURGERY Right    TONSILLECTOMY     VASECTOMY      Current Medications: Current Meds  Medication Sig   amLODipine (NORVASC) 5 MG tablet Take 1 tablet (5 mg total) by mouth daily.   apixaban (ELIQUIS) 5 MG TABS tablet TAKE ONE (1) TABLET BY MOUTH TWO (2) TIMES DAILY (Patient taking differently: Take 5 mg by mouth 2 (two) times daily.)   Cholecalciferol 125 MCG/ML LIQD Take 2 capsules by mouth daily.   finasteride (PROSCAR) 5 MG tablet Take 1 tablet by mouth daily.   furosemide (LASIX) 40 MG tablet Take 40 mg by mouth 2 (two) times a week.   glipiZIDE (GLUCOTROL XL) 10 MG 24 hr tablet Take 1 tablet by mouth daily.   HYDROcodone-acetaminophen (NORCO) 10-325 MG tablet Take 1 tablet by mouth 3 (three) times daily as needed for pain.   levofloxacin (LEVAQUIN) 500 MG tablet Take 500 mg by mouth 2 (two) times daily. Has 2 1/2 days remaining   lisinopril (ZESTRIL) 5 MG tablet Take 5 mg by mouth daily.   metoprolol succinate (TOPROL-XL) 100 MG 24 hr tablet Take 1 tablet (100 mg total) by mouth daily.   metoprolol succinate (TOPROL-XL) 100 MG 24 hr tablet Take one tablet daily in the morning and 1/2 tablet in the evening. (Patient taking differently: Take 100 mg by mouth See admin instructions. Take one tablet daily in the morning and 1/2 tablet in the evening.)   tamsulosin (FLOMAX) 0.4 MG CAPS capsule Take 0.4 mg by mouth at bedtime.     Allergies:   Tizanidine, Ferrous sulfate, and Codeine   Social History    Socioeconomic History   Marital status: Married    Spouse name: Not on file   Number of children: Not on file   Years of education: Not on file   Highest education level: Not on file  Occupational History   Occupation: Retired    Associate Professor: [employer]    Comment: Worked  as Sheriff for [employer]  Tobacco Use   Smoking status: Former    Packs/day: 2.50    Years: 17.00    Additional pack years: 0.00    Total pack years: 42.50    Types: Cigarettes    Quit date: 10/02/1971    Years since quitting: 51.5   Smokeless tobacco: Never  Vaping Use   Vaping Use: Never used  Substance and Sexual Activity   Alcohol use: No    Comment: none since 1977, heavy drinker prior   Drug use: No   Sexual activity: Not on file  Other Topics Concern   Not on file  Social History Narrative   Not on file   Social Determinants of Health   Financial Resource Strain: Not on file  Food Insecurity: Not on file  Transportation Needs: Not on file  Physical Activity: Not on file  Stress: Not on file  Social Connections: Not on file     Family History: The patient's family history includes Aortic aneurysm in his brother; COPD in his brother; Diabetes in his brother and father; Heart failure in his brother; Hepatitis in his father; Stroke in his mother. ROS:   Please see the history of present illness.    All 14 point review of systems negative except as described per history of present illness  EKGs/Labs/Other Studies Reviewed:         Recent Labs: No results found for requested labs within last 365 days.  Recent Lipid Panel    Component Value Date/Time   CHOL 147 02/08/2021 1515   TRIG 83 02/08/2021 1515   HDL 54 02/08/2021 1515   CHOLHDL 2.7 02/08/2021 1515   LDLCALC 77 02/08/2021 1515    Physical Exam:    VS:  BP (!) 148/98 (BP Location: Left Arm, Patient Position: Sitting)   Pulse (!) 107   Ht 6' (1.829 m)   Wt 247 lb 9.6 oz (112.3 kg)   SpO2 93%   BMI 33.58 kg/m     Wt  Readings from Last 3 Encounters:  03/29/23 247 lb 9.6 oz (112.3 kg)  03/26/22 293 lb (132.9 kg)  02/08/21 291 lb (132 kg)     GEN:  Well nourished, well developed in no acute distress HEENT: Normal NECK: No JVD; No carotid bruits LYMPHATICS: No lymphadenopathy CARDIAC: Irregularly irregular, no murmurs, no rubs, no gallops RESPIRATORY:  Clear to auscultation without rales, wheezing or rhonchi  ABDOMEN: Soft, non-tender, non-distended MUSCULOSKELETAL:  No edema; No deformity  SKIN: Warm and dry LOWER EXTREMITIES: no swelling NEUROLOGIC:  Alert and oriented x 3 PSYCHIATRIC:  Normal affect   ASSESSMENT:    1. Chronic atrial fibrillation (HCC)   2. Permanent atrial fibrillation (HCC)   3. PTE (pulmonary thromboembolism) (HCC)   4. Heart failure with mildly reduced ejection fraction (HFmrEF) (HCC)   5. Type 2 diabetes mellitus without complication, without long-term current use of insulin (HCC)    PLAN:    In order of problems listed above:  Permanent atrial fibrillation, rate controlled, continue present management.  He is anticoagulated Eliquis 5 mg twice daily which I will continue. History of pulmonary thromboembolism again anticoagulated. Congestive heart failure with mildly reduced left ventricle ejection fraction, difficult to judge he is [place] [employer] since he is rather sedentary anymore he walks around he walks with a cane probably 3.  Medication that he is on include Toprol-XL as well as Zestril.  Will check Chem-7 20 see if we can increase the dose  of lisinopril.  He does have history of kidney dysfunction which may be problematic to increase dose of his medication. Essential hypertension uncontrolled, but will hopefully be getting better with higher dose of Zestril.   Medication Adjustments/Labs and Tests Ordered: Current medicines are reviewed at length with the patient today.  Concerns regarding medicines are outlined above.  Orders Placed This Encounter   Procedures   EKG 12-Lead   Medication changes: No orders of the defined types were placed in this encounter.   Signed, Georgeanna Lea, MD, [facility] 03/29/2023 2:23 PM    [facility]

## 2023-03-29 NOTE — Addendum Note (Signed)
Addended by: Baldo Ash D on: 03/29/2023 02:29 PM   Modules accepted: Orders

## 2023-03-29 NOTE — Patient Instructions (Signed)
Medication Instructions:  Your physician recommends that you continue on your current medications as directed. Please refer to the Current Medication list given to you today.  *If you need a refill on your cardiac medications before your next appointment, please call your pharmacy*   Lab Work: BMP today If you have labs (blood work) drawn today and your tests are completely normal, you will receive your results only by: MyChart Message (if you have MyChart) OR A paper copy in the mail If you have any lab test that is abnormal or we need to change your treatment, we will call you to review the results.   Testing/Procedures: None Ordered   Follow-Up: At [facility], you and your health needs are our priority.  As part of our continuing mission to provide you with exceptional heart care, we have created designated Provider Care Teams.  These Care Teams include your primary Cardiologist (physician) and Advanced Practice Providers (APPs -  Physician Assistants and Nurse Practitioners) who all work together to provide you with the care you need, when you need it.  We recommend signing up for the patient portal called "MyChart".  Sign up information is provided on this After Visit Summary.  MyChart is used to connect with patients for Virtual Visits (Telemedicine).  Patients are able to view lab/test results, encounter notes, upcoming appointments, etc.  Non-urgent messages can be sent to your provider as well.   To learn more about what you can do with MyChart, go to [email or web address].    Your next appointment:   5 month(s)  The format for your next appointment:   In Person  Provider:   Robert Krasowski, MD    Other Instructions NA  

## 2023-03-30 LAB — BASIC METABOLIC PANEL
BUN/Creatinine Ratio: 9 — ABNORMAL LOW (ref 10–24)
BUN: 14 mg/dL (ref 8–27)
CO2: 24 mmol/L (ref 20–29)
Calcium: 9.2 mg/dL (ref 8.6–10.2)
Chloride: 103 mmol/L (ref 96–106)
Creatinine, Ser: 1.55 mg/dL — ABNORMAL HIGH (ref 0.76–1.27)
Glucose: 270 mg/dL — ABNORMAL HIGH (ref 70–99)
Potassium: 4 mmol/L (ref 3.5–5.2)
Sodium: 139 mmol/L (ref 134–144)
eGFR: 45 mL/min/{1.73_m2} — ABNORMAL LOW (ref >59)

## 2023-05-23 ENCOUNTER — Other Ambulatory Visit: Payer: Self-pay | Admitting: Cardiology

## 2023-05-23 DIAGNOSIS — I4821 Permanent atrial fibrillation: Secondary | ICD-10-CM

## 2023-05-23 DIAGNOSIS — I2699 Other pulmonary embolism without acute cor pulmonale: Secondary | ICD-10-CM

## 2023-05-23 NOTE — Telephone Encounter (Signed)
Prescription refill request for Eliquis received.  Indication: afib  Last office visit:Krasowski, 03/09/2023 Scr: 1.55, 03/29/2023 Age:  82 yo  Weight: 112.3 kg   Per dosing criteria pt qualifies for a dose decrease.

## 2023-05-24 NOTE — Telephone Encounter (Signed)
Looks like he was admitted to [facility] 02/06/23 and had small PE diagnosed at that time - should continue 5mg  BID dosing because of his recent PE.

## 2023-05-24 NOTE — Telephone Encounter (Signed)
Per Margaretmary Dys, PharmD dose should remain Eliquis 5mg  BID. Refill sent.

## 2023-09-01 DEATH — deceased
